# Patient Record
Sex: Female | Born: 1935 | Race: White | Hispanic: No | Smoking: Former smoker
Health system: Southern US, Community
[De-identification: ages and names within clinical notes are randomized; demographics above are authoritative.]

## PROBLEM LIST (undated history)

## (undated) DIAGNOSIS — Z923 Personal history of irradiation: Secondary | ICD-10-CM

## (undated) DIAGNOSIS — I10 Essential (primary) hypertension: Secondary | ICD-10-CM

## (undated) DIAGNOSIS — C50012 Malignant neoplasm of nipple and areola, left female breast: Secondary | ICD-10-CM

## (undated) DIAGNOSIS — E079 Disorder of thyroid, unspecified: Secondary | ICD-10-CM

## (undated) HISTORY — DX: Disorder of thyroid, unspecified: E07.9

## (undated) HISTORY — DX: Essential (primary) hypertension: I10

## (undated) HISTORY — DX: Malignant neoplasm of nipple and areola, left female breast: C50.012

## (undated) HISTORY — PX: CATARACT EXTRACTION: SUR2

---

## 2004-02-17 ENCOUNTER — Ambulatory Visit: Payer: Self-pay | Admitting: Gastroenterology

## 2004-05-08 ENCOUNTER — Ambulatory Visit: Payer: Self-pay | Admitting: Internal Medicine

## 2005-03-06 ENCOUNTER — Ambulatory Visit: Payer: Self-pay | Admitting: Ophthalmology

## 2005-03-12 ENCOUNTER — Ambulatory Visit: Payer: Self-pay | Admitting: Ophthalmology

## 2005-05-31 ENCOUNTER — Ambulatory Visit: Payer: Self-pay | Admitting: Internal Medicine

## 2005-06-06 ENCOUNTER — Ambulatory Visit: Payer: Self-pay | Admitting: Internal Medicine

## 2006-03-04 ENCOUNTER — Ambulatory Visit: Payer: Self-pay | Admitting: Ophthalmology

## 2006-03-11 ENCOUNTER — Ambulatory Visit: Payer: Self-pay | Admitting: Ophthalmology

## 2006-06-03 ENCOUNTER — Ambulatory Visit: Payer: Self-pay | Admitting: Internal Medicine

## 2007-06-05 ENCOUNTER — Ambulatory Visit: Payer: Self-pay | Admitting: Internal Medicine

## 2008-06-08 ENCOUNTER — Ambulatory Visit: Payer: Self-pay | Admitting: Internal Medicine

## 2008-06-10 ENCOUNTER — Ambulatory Visit: Payer: Self-pay | Admitting: Internal Medicine

## 2009-06-14 ENCOUNTER — Ambulatory Visit: Payer: Self-pay | Admitting: Internal Medicine

## 2010-06-20 ENCOUNTER — Ambulatory Visit: Payer: Self-pay | Admitting: Internal Medicine

## 2011-02-14 ENCOUNTER — Emergency Department: Payer: Self-pay | Admitting: Emergency Medicine

## 2011-02-14 LAB — BASIC METABOLIC PANEL
Anion Gap: 6 — ABNORMAL LOW (ref 7–16)
BUN: 17 mg/dL (ref 7–18)
Calcium, Total: 9.8 mg/dL (ref 8.5–10.1)
EGFR (African American): >60
EGFR (Non-African Amer.): >60
Glucose: 112 mg/dL — ABNORMAL HIGH (ref 65–99)
Potassium: 5.7 mmol/L — ABNORMAL HIGH (ref 3.5–5.1)
Sodium: 135 mmol/L — ABNORMAL LOW (ref 136–145)

## 2011-02-14 LAB — CBC
HGB: 12.9 g/dL (ref 12.0–16.0)
MCV: 95 fL (ref 80–100)
Platelet: 148 10*3/uL — ABNORMAL LOW (ref 150–440)
RBC: 4.01 10*6/uL (ref 3.80–5.20)
RDW: 13.4 % (ref 11.5–14.5)
WBC: 9.4 10*3/uL (ref 3.6–11.0)

## 2011-02-19 ENCOUNTER — Encounter: Payer: Self-pay | Admitting: Internal Medicine

## 2011-03-09 ENCOUNTER — Encounter: Payer: Self-pay | Admitting: Internal Medicine

## 2011-04-17 ENCOUNTER — Ambulatory Visit: Payer: Self-pay | Admitting: Oncology

## 2011-04-17 LAB — CBC CANCER CENTER
Basophil #: 0 x10 3/mm (ref 0.0–0.1)
Eosinophil %: 0.6 %
HCT: 35.1 % (ref 35.0–47.0)
Lymphocyte #: 1.9 x10 3/mm (ref 1.0–3.6)
Lymphocyte %: 27.3 %
Monocyte %: 9.9 %
Neutrophil #: 4.2 x10 3/mm (ref 1.4–6.5)
RDW: 14.3 % (ref 11.5–14.5)
WBC: 6.8 x10 3/mm (ref 3.6–11.0)

## 2011-04-17 LAB — COMPREHENSIVE METABOLIC PANEL
Albumin: 4.1 g/dL (ref 3.4–5.0)
Alkaline Phosphatase: 64 U/L (ref 50–136)
Anion Gap: 9 (ref 7–16)
Bilirubin,Total: 0.6 mg/dL (ref 0.2–1.0)
Chloride: 102 mmol/L (ref 98–107)
Co2: 31 mmol/L (ref 21–32)
Creatinine: 1.07 mg/dL (ref 0.60–1.30)
Glucose: 99 mg/dL (ref 65–99)
Osmolality: 288 (ref 275–301)
SGOT(AST): 20 U/L (ref 15–37)
SGPT (ALT): 35 U/L

## 2011-04-18 LAB — CANCER ANTIGEN 19-9: CA 19-9: 14 U/mL (ref 0–35)

## 2011-05-07 ENCOUNTER — Ambulatory Visit: Payer: Self-pay | Admitting: Oncology

## 2011-06-06 ENCOUNTER — Ambulatory Visit: Payer: Self-pay | Admitting: Oncology

## 2011-07-24 ENCOUNTER — Ambulatory Visit: Payer: Self-pay | Admitting: Oncology

## 2011-08-06 ENCOUNTER — Ambulatory Visit: Payer: Self-pay | Admitting: Oncology

## 2011-12-24 ENCOUNTER — Ambulatory Visit: Payer: Self-pay | Admitting: Oncology

## 2012-02-11 ENCOUNTER — Ambulatory Visit: Payer: Self-pay | Admitting: Oncology

## 2012-03-08 ENCOUNTER — Ambulatory Visit: Payer: Self-pay | Admitting: Oncology

## 2012-06-25 ENCOUNTER — Ambulatory Visit: Payer: Self-pay | Admitting: Family Medicine

## 2012-08-05 ENCOUNTER — Ambulatory Visit: Payer: Self-pay | Admitting: Oncology

## 2012-08-20 LAB — COMPREHENSIVE METABOLIC PANEL
Alkaline Phosphatase: 58 U/L (ref 50–136)
Anion Gap: 5 — ABNORMAL LOW (ref 7–16)
BUN: 26 mg/dL — ABNORMAL HIGH (ref 7–18)
Bilirubin,Total: 0.8 mg/dL (ref 0.2–1.0)
Calcium, Total: 9.4 mg/dL (ref 8.5–10.1)
Chloride: 100 mmol/L (ref 98–107)
Co2: 31 mmol/L (ref 21–32)
Creatinine: 1.29 mg/dL (ref 0.60–1.30)
EGFR (African American): 44 — ABNORMAL LOW
Osmolality: 279 (ref 275–301)
SGOT(AST): 24 U/L (ref 15–37)
SGPT (ALT): 31 U/L (ref 12–78)

## 2012-08-20 LAB — CBC CANCER CENTER
Eosinophil #: 0.1 x10 3/mm (ref 0.0–0.7)
HGB: 13.7 g/dL (ref 12.0–16.0)
Lymphocyte %: 43.2 %
MCHC: 34.9 g/dL (ref 32.0–36.0)
Monocyte %: 12.5 %
Neutrophil %: 42.5 %
RBC: 4.31 10*6/uL (ref 3.80–5.20)
RDW: 13.8 % (ref 11.5–14.5)

## 2012-09-05 ENCOUNTER — Ambulatory Visit: Payer: Self-pay | Admitting: Oncology

## 2013-02-05 DIAGNOSIS — C50012 Malignant neoplasm of nipple and areola, left female breast: Secondary | ICD-10-CM

## 2013-02-05 HISTORY — PX: BREAST BIOPSY: SHX20

## 2013-02-05 HISTORY — PX: BREAST LUMPECTOMY: SHX2

## 2013-02-05 HISTORY — DX: Malignant neoplasm of nipple and areola, left female breast: C50.012

## 2013-07-06 ENCOUNTER — Ambulatory Visit: Payer: Self-pay | Admitting: Family Medicine

## 2013-08-19 ENCOUNTER — Ambulatory Visit: Payer: Self-pay | Admitting: Surgery

## 2013-08-19 LAB — BASIC METABOLIC PANEL
Anion Gap: 7 (ref 7–16)
BUN: 20 mg/dL — AB (ref 7–18)
CALCIUM: 9.4 mg/dL (ref 8.5–10.1)
CHLORIDE: 102 mmol/L (ref 98–107)
CO2: 30 mmol/L (ref 21–32)
Creatinine: 1.02 mg/dL (ref 0.60–1.30)
EGFR (Non-African Amer.): 50 — ABNORMAL LOW
GFR CALC AF AMER: 59 — AB
Glucose: 94 mg/dL (ref 65–99)
OSMOLALITY: 280 (ref 275–301)
Potassium: 3.6 mmol/L (ref 3.5–5.1)
Sodium: 139 mmol/L (ref 136–145)

## 2013-08-19 LAB — CBC
HCT: 39.1 % (ref 35.0–47.0)
HGB: 13.4 g/dL (ref 12.0–16.0)
MCH: 31.9 pg (ref 26.0–34.0)
MCHC: 34.3 g/dL (ref 32.0–36.0)
MCV: 93 fL (ref 80–100)
Platelet: 185 10*3/uL (ref 150–440)
RBC: 4.21 10*6/uL (ref 3.80–5.20)
RDW: 13.4 % (ref 11.5–14.5)
WBC: 7.1 10*3/uL (ref 3.6–11.0)

## 2013-08-28 ENCOUNTER — Ambulatory Visit: Payer: Self-pay | Admitting: Surgery

## 2013-09-02 LAB — PATHOLOGY REPORT

## 2013-09-17 ENCOUNTER — Ambulatory Visit: Payer: Self-pay | Admitting: Oncology

## 2013-09-23 LAB — COMPREHENSIVE METABOLIC PANEL
ANION GAP: 10 (ref 7–16)
AST: 23 U/L (ref 15–37)
Albumin: 4.1 g/dL (ref 3.4–5.0)
Alkaline Phosphatase: 55 U/L
BUN: 14 mg/dL (ref 7–18)
Bilirubin,Total: 0.6 mg/dL (ref 0.2–1.0)
CALCIUM: 8.6 mg/dL (ref 8.5–10.1)
CHLORIDE: 102 mmol/L (ref 98–107)
Co2: 30 mmol/L (ref 21–32)
Creatinine: 1.32 mg/dL — ABNORMAL HIGH (ref 0.60–1.30)
EGFR (Non-African Amer.): 37 — ABNORMAL LOW
GFR CALC AF AMER: 43 — AB
Glucose: 103 mg/dL — ABNORMAL HIGH (ref 65–99)
OSMOLALITY: 284 (ref 275–301)
Potassium: 3.4 mmol/L — ABNORMAL LOW (ref 3.5–5.1)
SGPT (ALT): 36 U/L
Sodium: 142 mmol/L (ref 136–145)
TOTAL PROTEIN: 7.5 g/dL (ref 6.4–8.2)

## 2013-09-23 LAB — CBC CANCER CENTER
Basophil #: 0.1 x10 3/mm (ref 0.0–0.1)
Basophil %: 1 %
Eosinophil #: 0.1 x10 3/mm (ref 0.0–0.7)
Eosinophil %: 1.3 %
HCT: 41 % (ref 35.0–47.0)
HGB: 13.8 g/dL (ref 12.0–16.0)
LYMPHS PCT: 37 %
Lymphocyte #: 2.1 x10 3/mm (ref 1.0–3.6)
MCH: 31.4 pg (ref 26.0–34.0)
MCHC: 33.6 g/dL (ref 32.0–36.0)
MCV: 93 fL (ref 80–100)
Monocyte #: 0.6 x10 3/mm (ref 0.2–0.9)
Monocyte %: 10.6 %
Neutrophil #: 2.8 x10 3/mm (ref 1.4–6.5)
Neutrophil %: 50.1 %
Platelet: 181 x10 3/mm (ref 150–440)
RBC: 4.4 10*6/uL (ref 3.80–5.20)
RDW: 13.3 % (ref 11.5–14.5)
WBC: 5.5 x10 3/mm (ref 3.6–11.0)

## 2013-09-23 LAB — CREATINE: Creatine, Serum: 1.32

## 2013-09-28 LAB — PROT IMMUNOELECTROPHORES(ARMC)

## 2013-09-28 LAB — KAPPA/LAMBDA FREE LIGHT CHAINS (ARMC)

## 2013-10-06 ENCOUNTER — Ambulatory Visit: Payer: Self-pay | Admitting: Oncology

## 2013-10-23 LAB — CBC CANCER CENTER
BASOS PCT: 0.7 %
Basophil #: 0 x10 3/mm (ref 0.0–0.1)
Eosinophil #: 0.1 x10 3/mm (ref 0.0–0.7)
Eosinophil %: 1.9 %
HCT: 39.4 % (ref 35.0–47.0)
HGB: 13.2 g/dL (ref 12.0–16.0)
LYMPHS PCT: 37.3 %
Lymphocyte #: 2.1 x10 3/mm (ref 1.0–3.6)
MCH: 31.1 pg (ref 26.0–34.0)
MCHC: 33.6 g/dL (ref 32.0–36.0)
MCV: 93 fL (ref 80–100)
MONOS PCT: 11.1 %
Monocyte #: 0.6 x10 3/mm (ref 0.2–0.9)
Neutrophil #: 2.7 x10 3/mm (ref 1.4–6.5)
Neutrophil %: 49 %
Platelet: 174 x10 3/mm (ref 150–440)
RBC: 4.25 10*6/uL (ref 3.80–5.20)
RDW: 13.3 % (ref 11.5–14.5)
WBC: 5.6 x10 3/mm (ref 3.6–11.0)

## 2013-10-30 LAB — CBC CANCER CENTER
BASOS ABS: 0 x10 3/mm (ref 0.0–0.1)
Basophil %: 0.7 %
Eosinophil #: 0.1 x10 3/mm (ref 0.0–0.7)
Eosinophil %: 2.4 %
HCT: 39.5 % (ref 35.0–47.0)
HGB: 13.2 g/dL (ref 12.0–16.0)
Lymphocyte #: 2 x10 3/mm (ref 1.0–3.6)
Lymphocyte %: 36.5 %
MCH: 31.2 pg (ref 26.0–34.0)
MCHC: 33.4 g/dL (ref 32.0–36.0)
MCV: 93 fL (ref 80–100)
MONO ABS: 0.6 x10 3/mm (ref 0.2–0.9)
Monocyte %: 10.6 %
NEUTROS PCT: 49.8 %
Neutrophil #: 2.7 x10 3/mm (ref 1.4–6.5)
Platelet: 180 x10 3/mm (ref 150–440)
RBC: 4.24 10*6/uL (ref 3.80–5.20)
RDW: 13.4 % (ref 11.5–14.5)
WBC: 5.4 x10 3/mm (ref 3.6–11.0)

## 2013-11-05 ENCOUNTER — Ambulatory Visit: Payer: Self-pay | Admitting: Oncology

## 2013-11-06 LAB — CBC CANCER CENTER
BASOS PCT: 1 %
Basophil #: 0.1 x10 3/mm (ref 0.0–0.1)
Eosinophil #: 0.1 x10 3/mm (ref 0.0–0.7)
Eosinophil %: 2.1 %
HCT: 39.5 % (ref 35.0–47.0)
HGB: 13 g/dL (ref 12.0–16.0)
Lymphocyte #: 2 x10 3/mm (ref 1.0–3.6)
Lymphocyte %: 34.4 %
MCH: 30.9 pg (ref 26.0–34.0)
MCHC: 33 g/dL (ref 32.0–36.0)
MCV: 94 fL (ref 80–100)
MONOS PCT: 13.2 %
Monocyte #: 0.8 x10 3/mm (ref 0.2–0.9)
Neutrophil #: 2.9 x10 3/mm (ref 1.4–6.5)
Neutrophil %: 49.3 %
Platelet: 182 x10 3/mm (ref 150–440)
RBC: 4.22 10*6/uL (ref 3.80–5.20)
RDW: 13.3 % (ref 11.5–14.5)
WBC: 5.8 x10 3/mm (ref 3.6–11.0)

## 2013-11-13 LAB — CBC CANCER CENTER
BASOS ABS: 0.1 x10 3/mm (ref 0.0–0.1)
Basophil %: 1 %
EOS PCT: 2.1 %
Eosinophil #: 0.1 x10 3/mm (ref 0.0–0.7)
HCT: 38.6 % (ref 35.0–47.0)
HGB: 13 g/dL (ref 12.0–16.0)
LYMPHS PCT: 29.6 %
Lymphocyte #: 1.5 x10 3/mm (ref 1.0–3.6)
MCH: 31.5 pg (ref 26.0–34.0)
MCHC: 33.8 g/dL (ref 32.0–36.0)
MCV: 93 fL (ref 80–100)
MONO ABS: 0.7 x10 3/mm (ref 0.2–0.9)
Monocyte %: 14.2 %
NEUTROS PCT: 53.1 %
Neutrophil #: 2.7 x10 3/mm (ref 1.4–6.5)
Platelet: 164 x10 3/mm (ref 150–440)
RBC: 4.15 10*6/uL (ref 3.80–5.20)
RDW: 13.7 % (ref 11.5–14.5)
WBC: 5 x10 3/mm (ref 3.6–11.0)

## 2013-11-20 LAB — CBC CANCER CENTER
BASOS PCT: 0.8 %
Basophil #: 0 x10 3/mm (ref 0.0–0.1)
Eosinophil #: 0.1 x10 3/mm (ref 0.0–0.7)
Eosinophil %: 1.8 %
HCT: 38.7 % (ref 35.0–47.0)
HGB: 13.1 g/dL (ref 12.0–16.0)
LYMPHS ABS: 1.3 x10 3/mm (ref 1.0–3.6)
LYMPHS PCT: 29.4 %
MCH: 31.6 pg (ref 26.0–34.0)
MCHC: 34 g/dL (ref 32.0–36.0)
MCV: 93 fL (ref 80–100)
Monocyte #: 0.6 x10 3/mm (ref 0.2–0.9)
Monocyte %: 13.3 %
NEUTROS PCT: 54.7 %
Neutrophil #: 2.4 x10 3/mm (ref 1.4–6.5)
Platelet: 169 x10 3/mm (ref 150–440)
RBC: 4.15 10*6/uL (ref 3.80–5.20)
RDW: 13.7 % (ref 11.5–14.5)
WBC: 4.4 x10 3/mm (ref 3.6–11.0)

## 2013-11-27 LAB — CBC CANCER CENTER
Basophil #: 0 x10 3/mm (ref 0.0–0.1)
Basophil %: 1 %
EOS ABS: 0.1 x10 3/mm (ref 0.0–0.7)
EOS PCT: 1.4 %
HCT: 39.5 % (ref 35.0–47.0)
HGB: 13.2 g/dL (ref 12.0–16.0)
LYMPHS PCT: 28.4 %
Lymphocyte #: 1.5 x10 3/mm (ref 1.0–3.6)
MCH: 31.5 pg (ref 26.0–34.0)
MCHC: 33.5 g/dL (ref 32.0–36.0)
MCV: 94 fL (ref 80–100)
MONO ABS: 0.6 x10 3/mm (ref 0.2–0.9)
Monocyte %: 11 %
NEUTROS ABS: 3 x10 3/mm (ref 1.4–6.5)
NEUTROS PCT: 58.2 %
PLATELETS: 172 x10 3/mm (ref 150–440)
RBC: 4.2 10*6/uL (ref 3.80–5.20)
RDW: 13.5 % (ref 11.5–14.5)
WBC: 5.1 x10 3/mm (ref 3.6–11.0)

## 2013-12-06 ENCOUNTER — Ambulatory Visit: Payer: Self-pay | Admitting: Oncology

## 2014-01-05 ENCOUNTER — Ambulatory Visit: Payer: Self-pay | Admitting: Oncology

## 2014-05-25 DIAGNOSIS — C50012 Malignant neoplasm of nipple and areola, left female breast: Secondary | ICD-10-CM | POA: Insufficient documentation
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
## 2014-05-29 NOTE — Op Note (Signed)
PATIENT NAME:  Christine Owen, Christine Owen MR#:  [id number] DATE OF BIRTH:  Dec 06, 1926  DATE OF PROCEDURE:  08/28/2013  PREOPERATIVE DIAGNOSIS: Paget disease of the left nipple.   POSTOPERATIVE DIAGNOSIS: Paget disease of the left nipple.   PROCEDURE: Left partial mastectomy.   SURGEON: Loreli Dollar, M.D.   ANESTHESIA: General.   INDICATIONS: This 81 year old female, who recently developed some slight red discoloration of the left nipple. A nipple biopsy demonstrated Paget disease. There was no invasive cancer seen, but was in situ. Surgery was recommended for definitive treatment.   DESCRIPTION OF PROCEDURE: The patient was placed on the operating table in the supine position under general anesthesia. The left arm was placed on a lateral arm support. The left breast was prepared with ChloraPrep and draped in a sterile manner.   A transversely oriented elliptical excision was carried out removing most of the areola and all of the nipple, and dissected down through subcutaneous tissues. Electrocautery was used for hemostasis. The dissection was carried approximately 2 cm deep into the central aspect of the breast. The 3 o'clock lateral end of the skin ellipse was tagged with a stitch for the pathologist's orientation, and the specimen was submitted for routine pathology. The wound was inspected. One clamped bleeding point was suture ligated with 4-0 chromic. A number of other bleeding points were cauterized. Hemostasis was subsequently intact.   Next, the subcutaneous tissues were approximated with interrupted 4-0 chromic. The skin was closed with running 4-0 Monocryl subcuticular suture and Dermabond. The patient tolerated the surgery satisfactorily and was prepared for transfer to the recovery room.   ____________________________ Lenna Sciara. Rochel Brome, MD jws:jr D: 08/28/2013 13:31:37 ET T: 08/28/2013 14:50:27 ET JOB#: [id number]  cc: Loreli Dollar, MD, <Dictator> Loreli Dollar MD ELECTRONICALLY  SIGNED 09/02/2013 17:44

## 2014-06-16 ENCOUNTER — Ambulatory Visit: Payer: Self-pay | Admitting: Radiation Oncology

## 2014-06-16 ENCOUNTER — Ambulatory Visit
Admission: RE | Admit: 2014-06-16 | Discharge: 2014-06-16 | Disposition: A | Discharge status: Home / Self Care | Payer: Medicare Other | Source: Ambulatory Visit | Attending: Radiation Oncology | Admitting: Radiation Oncology

## 2014-06-16 ENCOUNTER — Encounter (INDEPENDENT_AMBULATORY_CARE_PROVIDER_SITE_OTHER): Payer: Self-pay

## 2014-06-16 ENCOUNTER — Encounter: Payer: Self-pay | Admitting: Radiation Oncology

## 2014-06-16 VITALS — BP 152/72 | HR 64 | Temp 97.0°F | Resp 20 | Ht 61.0 in | Wt 127.6 lb

## 2014-06-16 DIAGNOSIS — C50912 Malignant neoplasm of unspecified site of left female breast: Secondary | ICD-10-CM

## 2014-06-16 NOTE — Progress Notes (Signed)
Radiation Oncology Follow up Note  Name: Christine Owen   Date:   06/16/2014 MRN:  [id number] DOB: 06-11-1926    This 81 y.o. female presents to the clinic today for follow-up of breast cancer i.e. Paget's disease.  REFERRING PROVIDER: Dr. Tamala Julian  HPI: Patient is an 81 year old female now out 6 months having completed radiation therapy to her left breast after sacrifice of her nipple areolar complex for Paget's disease. Patient had declined aromatase inhibitor and still on my recommendation declines that treatment. She is otherwise doing well. She specifically denies breast tenderness cough or bone pain. Has yet not yet had a follow-up mammogram still confused about her appointments and I am arranging for her to see Dr. Tamala Julian in follow-up..  COMPLICATIONS OF TREATMENT: none  FOLLOW UP COMPLIANCE: keeps appointments   PHYSICAL EXAM:  BP 152/72 mmHg  Pulse 64  Temp(Src) 97 F (36.1 C)  Resp 20  Ht 5\' 1"  (1.549 m)  Wt 127 lb 10.3 oz (57.9 kg)  BMI 24.13 kg/m2 Well-developed elderly female in NAD. She is status post wide local excision of the left breast with some sacrifice the nipple areolar complex. No dominant mass or nodularity is noted in either breast in 2 positions examined. No axillary or supraclavicular adenopathy is identified. Lungs are clear to A&P cardiac examination is essentially unremarkable. Well-developed well-nourished patient in NAD. HEENT reveals PERLA, EOMI, discs not visualized.  Oral cavity is clear. No oral mucosal lesions are identified. Neck is clear without evidence of cervical or supraclavicular adenopathy. Lungs are clear to A&P. Cardiac examination is essentially unremarkable with regular rate and rhythm without murmur rub or thrill. Abdomen is benign with no organomegaly or masses noted. Motor sensory and DTR levels are equal and symmetric in the upper and lower extremities. Cranial nerves II through XII are grossly intact. Proprioception is intact. No  peripheral adenopathy or edema is identified. No motor or sensory levels are noted. Crude visual fields are within normal range.   RADIOLOGY RESULTS: Follow-up mammograms will be ordered  PLAN: At this time am please were overall progress. She still confused better appointments nature my nurse arranges for her to see Dr. Tamala Julian in follow-up and he tends to and will order her follow-up mammograms. I have asked to see her back in 1 year for follow-up. Again she is declining aromatase inhibitor therapy. Patient knows to call with any concerns.  I would like to take this opportunity for allowing me to participate in the care of your patient.Armstead Peaks., MD

## 2014-06-18 ENCOUNTER — Other Ambulatory Visit: Payer: Self-pay | Admitting: *Deleted

## 2014-06-18 DIAGNOSIS — C50012 Malignant neoplasm of nipple and areola, left female breast: Secondary | ICD-10-CM

## 2014-06-22 ENCOUNTER — Inpatient Hospital Stay: Payer: Medicare Other | Attending: Oncology | Admitting: Oncology

## 2014-06-22 ENCOUNTER — Inpatient Hospital Stay: Payer: Medicare Other

## 2014-06-22 VITALS — BP 150/91 | HR 66 | Temp 96.1°F | Wt 129.2 lb

## 2014-06-22 DIAGNOSIS — E079 Disorder of thyroid, unspecified: Secondary | ICD-10-CM | POA: Diagnosis not present

## 2014-06-22 DIAGNOSIS — Z853 Personal history of malignant neoplasm of breast: Secondary | ICD-10-CM

## 2014-06-22 DIAGNOSIS — I10 Essential (primary) hypertension: Secondary | ICD-10-CM

## 2014-06-22 DIAGNOSIS — Z8781 Personal history of (healed) traumatic fracture: Secondary | ICD-10-CM

## 2014-06-22 DIAGNOSIS — Z923 Personal history of irradiation: Secondary | ICD-10-CM | POA: Diagnosis not present

## 2014-06-22 DIAGNOSIS — Z79899 Other long term (current) drug therapy: Secondary | ICD-10-CM | POA: Diagnosis not present

## 2014-06-22 DIAGNOSIS — M545 Low back pain: Secondary | ICD-10-CM

## 2014-06-22 DIAGNOSIS — Z87891 Personal history of nicotine dependence: Secondary | ICD-10-CM | POA: Diagnosis not present

## 2014-06-22 DIAGNOSIS — C50012 Malignant neoplasm of nipple and areola, left female breast: Secondary | ICD-10-CM

## 2014-06-22 DIAGNOSIS — M818 Other osteoporosis without current pathological fracture: Secondary | ICD-10-CM | POA: Diagnosis not present

## 2014-06-22 LAB — COMPREHENSIVE METABOLIC PANEL
ALT: 23 U/L (ref 14–54)
AST: 22 U/L (ref 15–41)
Albumin: 4.3 g/dL (ref 3.5–5.0)
Alkaline Phosphatase: 40 U/L (ref 38–126)
Anion gap: 8 (ref 5–15)
BILIRUBIN TOTAL: 0.9 mg/dL (ref 0.3–1.2)
BUN: 19 mg/dL (ref 6–20)
CALCIUM: 9.5 mg/dL (ref 8.9–10.3)
CO2: 29 mmol/L (ref 22–32)
Chloride: 101 mmol/L (ref 101–111)
Creatinine, Ser: 1.06 mg/dL — ABNORMAL HIGH (ref 0.44–1.00)
GFR, EST AFRICAN AMERICAN: 54 mL/min — AB (ref >60)
GFR, EST NON AFRICAN AMERICAN: 47 mL/min — AB (ref >60)
Glucose, Bld: 92 mg/dL (ref 65–99)
Potassium: 3.5 mmol/L (ref 3.5–5.1)
SODIUM: 138 mmol/L (ref 135–145)
Total Protein: 6.9 g/dL (ref 6.5–8.1)

## 2014-06-22 LAB — CBC WITH DIFFERENTIAL/PLATELET
BASOS ABS: 0 10*3/uL (ref 0–0.1)
Basophils Relative: 1 %
EOS ABS: 0.1 10*3/uL (ref 0–0.7)
Eosinophils Relative: 1 %
HEMATOCRIT: 38.6 % (ref 35.0–47.0)
HEMOGLOBIN: 13 g/dL (ref 12.0–16.0)
LYMPHS ABS: 1.5 10*3/uL (ref 1.0–3.6)
Lymphocytes Relative: 30 %
MCH: 31.7 pg (ref 26.0–34.0)
MCHC: 33.7 g/dL (ref 32.0–36.0)
MCV: 93.9 fL (ref 80.0–100.0)
Monocytes Absolute: 0.6 10*3/uL (ref 0.2–0.9)
Monocytes Relative: 13 %
NEUTROS PCT: 55 %
Neutro Abs: 2.7 10*3/uL (ref 1.4–6.5)
PLATELETS: 172 10*3/uL (ref 150–440)
RBC: 4.11 MIL/uL (ref 3.80–5.20)
RDW: 13.8 % (ref 11.5–14.5)
WBC: 4.9 10*3/uL (ref 3.6–11.0)

## 2014-06-27 ENCOUNTER — Encounter: Payer: Self-pay | Admitting: Oncology

## 2014-06-27 DIAGNOSIS — S12100A Unspecified displaced fracture of second cervical vertebra, initial encounter for closed fracture: Secondary | ICD-10-CM | POA: Insufficient documentation

## 2014-06-27 DIAGNOSIS — E039 Hypothyroidism, unspecified: Secondary | ICD-10-CM | POA: Insufficient documentation

## 2014-06-27 DIAGNOSIS — K219 Gastro-esophageal reflux disease without esophagitis: Secondary | ICD-10-CM | POA: Insufficient documentation

## 2014-06-27 DIAGNOSIS — S22009A Unspecified fracture of unspecified thoracic vertebra, initial encounter for closed fracture: Secondary | ICD-10-CM | POA: Insufficient documentation

## 2014-06-27 DIAGNOSIS — H409 Unspecified glaucoma: Secondary | ICD-10-CM | POA: Insufficient documentation

## 2014-06-27 DIAGNOSIS — S3210XA Unspecified fracture of sacrum, initial encounter for closed fracture: Secondary | ICD-10-CM | POA: Insufficient documentation

## 2014-06-27 DIAGNOSIS — R7303 Prediabetes: Secondary | ICD-10-CM | POA: Insufficient documentation

## 2014-06-27 NOTE — Progress Notes (Signed)
[place] @ [facility] [phone]  Fax:([phone]) [place] OB: 19-Apr-1926  MR#: [id number]  JYN#:[id number]  Patient Care Team: Dion Body, MD as PCP - General (Family Medicine)  CHIEF COMPLAINT:  Chief Complaint  Patient presents with  . Follow-up    Oncology History   81 year old female status post wide local excision of nipple areole complex of the right breast for Paget's disease  STATUS POST RADIATION THERAPY (FINISHED IN oCTOBER OF 2015) pATIENT IS NOT EAGER TO START ANY ANTI-HORMONAL THERAPY        Paget's disease of left breast    Initial Diagnosis Paget's disease of left breast    No flowsheet data found.  INTERVAL HISTORY: 81 year old lady with history of Paget's disease of the right breast.  Patient does not want any anti-hormonal therapy.  Patient is osteoporosis cervical fracture and low back pain secondary to compression fracture.  Patient is here for ongoing evaluation and treatment consideration.  No bony pains.  Getting regular mammogram.  REVIEW OF SYSTEMS:   Gen. status: Patient is feeling better.  HEENT: Neck pain is improved.  No soreness in the mouth no difficulty swallowing.  Lungs: No cough or shortness of breath.  Cardiac no chest pain.  . GI : no nausea.  No vomiting.  No diarrhea.  No rectal bleeding.  Musculoskeletal system: Chronic low back pain.  Neck pain is improved. Lower extremity no swelling. Neurological system no headache or dizziness. Skin: No rash. All other systems have been reviewed   As per HPI. Otherwise, a complete review of systems is negatve.  PAST MEDICAL HISTORY: Past Medical History  Diagnosis Date  . Hypertension   . Thyroid disease   . Paget's disease of left breast     PAST SURGICAL HISTORY: Past Surgical History  Procedure Laterality Date  . Cataract extraction      FAMILY HISTORY No family history on file.  GYNECOLOGIC HISTORY:  No LMP recorded.     ADVANCED  DIRECTIVES:    HEALTH MAINTENANCE: History  Substance Use Topics  . Smoking status: Former Research scientist (life sciences)  . Smokeless tobacco: Not on file  . Alcohol Use: Not on file     Colonoscopy:  PAP:  Bone density:  Lipid panel:  Allergies  Allergen Reactions  . Alendronate Sodium Other (See Comments)    Worsened acid reflux  . Ibandronic Acid Other (See Comments)    Worsened acid reflux  . Chlorhexidine Rash    Current Outpatient Prescriptions  Medication Sig Dispense Refill  . atorvastatin (LIPITOR) 20 MG tablet Take 20 mg by mouth daily. At bedtime    . Calcium Carb-Cholecalciferol 600-200 MG-UNIT TABS Take 1 tablet by mouth 3 (three) times daily.    Marland Kitchen denosumab (PROLIA) 60 MG/ML SOLN injection Inject 60 mg into the skin every 6 (six) months. Administer in upper arm, thigh, or abdomen once a year.    . hydrochlorothiazide (HYDRODIURIL) 25 MG tablet Take 25 mg by mouth daily.    Marland Kitchen levothyroxine (SYNTHROID, LEVOTHROID) 75 MCG tablet Take 75 mcg by mouth daily before breakfast.    . meclizine (ANTIVERT) 25 MG tablet Take 25 mg by mouth 3 (three) times daily as needed for dizziness.    Marland Kitchen omeprazole (PRILOSEC) 20 MG capsule Take 20 mg by mouth daily.    . timolol (TIMOPTIC) 0.5 % ophthalmic solution     . latanoprost (XALATAN) 0.005 % ophthalmic solution Place 1 drop into both eyes at bedtime.    Marland Kitchen  traMADol (ULTRAM) 50 MG tablet Take by mouth every 4 (four) hours as needed for moderate pain.     No current facility-administered medications for this visit.    OBJECTIVE:  Filed Vitals:   06/22/14 1123  BP: 150/91  Pulse: 66  Temp: 96.1 F (35.6 C)     Body mass index is 24.42 kg/(m^2).    ECOG FS:1 - Symptomatic but completely ambulatory  PHYSICAL EXAM: GENERAL:  Well developed, well nourished, sitting comfortably in the exam room in no acute distress. MENTAL STATUS:  Alert and oriented to person, place and time. HEAD:    Normocephalic, atraumatic, face symmetric, no Cushingoid  features. EYES: . oropharynx clear without lesion.  Tongue normal. Mucous membranes moist.  RESPIRATORY:  Clear to auscultation without rales, wheezes or rhonchi. CARDIOVASCULAR:  Regular rate and rhythm without murmur, rub or gallop. BREAST:  Right breast without masses, skin changes or nipple discharge.  Left breast without masses, skin changes or nipple discharge. ABDOMEN:  Soft, non-tender, with active bowel sounds, and no hepatosplenomegaly.  No masses. BACK:  No CVA tenderness.  No tenderness on percussion of the back or rib cage. SKIN:  No rashes, ulcers or lesions. EXTREMITIES: No edema, no skin discoloration or tenderness.  No palpable cords. LYMPH NODES: No palpable cervical, supraclavicular, axillary or inguinal adenopathy  NEUROLOGICAL: Unremarkable. PSYCH:  Appropriate.  LAB RESULTS:  Appointment on 06/22/2014  Component Date Value Ref Range Status  . WBC 06/22/2014 4.9  3.6 - 11.0 K/uL Final  . RBC 06/22/2014 4.11  3.80 - 5.20 MIL/uL Final  . Hemoglobin 06/22/2014 13.0  12.0 - 16.0 g/dL Final  . HCT 06/22/2014 38.6  35.0 - 47.0 % Final  . MCV 06/22/2014 93.9  80.0 - 100.0 fL Final  . MCH 06/22/2014 31.7  26.0 - 34.0 pg Final  . MCHC 06/22/2014 33.7  32.0 - 36.0 g/dL Final  . RDW 06/22/2014 13.8  11.5 - 14.5 % Final  . Platelets 06/22/2014 172  150 - 440 K/uL Final  . Neutrophils Relative % 06/22/2014 55   Final  . Neutro Abs 06/22/2014 2.7  1.4 - 6.5 K/uL Final  . Lymphocytes Relative 06/22/2014 30   Final  . Lymphs Abs 06/22/2014 1.5  1.0 - 3.6 K/uL Final  . Monocytes Relative 06/22/2014 13   Final  . Monocytes Absolute 06/22/2014 0.6  0.2 - 0.9 K/uL Final  . Eosinophils Relative 06/22/2014 1   Final  . Eosinophils Absolute 06/22/2014 0.1  0 - 0.7 K/uL Final  . Basophils Relative 06/22/2014 1   Final  . Basophils Absolute 06/22/2014 0.0  0 - 0.1 K/uL Final  . Sodium 06/22/2014 138  135 - 145 mmol/L Final  . Potassium 06/22/2014 3.5  3.5 - 5.1 mmol/L Final  .  Chloride 06/22/2014 101  101 - 111 mmol/L Final  . CO2 06/22/2014 29  22 - 32 mmol/L Final  . Glucose, Bld 06/22/2014 92  65 - 99 mg/dL Final  . BUN 06/22/2014 19  6 - 20 mg/dL Final  . Creatinine, Ser 06/22/2014 1.06* 0.44 - 1.00 mg/dL Final  . Calcium 06/22/2014 9.5  8.9 - 10.3 mg/dL Final  . Total Protein 06/22/2014 6.9  6.5 - 8.1 g/dL Final  . Albumin 06/22/2014 4.3  3.5 - 5.0 g/dL Final  . AST 06/22/2014 22  15 - 41 U/L Final  . ALT 06/22/2014 23  14 - 54 U/L Final  . Alkaline Phosphatase 06/22/2014 40  38 - 126 U/L Final  .  Total Bilirubin 06/22/2014 0.9  0.3 - 1.2 mg/dL Final  . GFR calc non Af Amer 06/22/2014 47* >60 mL/min Final  . GFR calc Af Amer 06/22/2014 54* >60 mL/min Final   Comment: (NOTE) The eGFR has been calculated using the CKD EPI equation. This calculation has not been validated in all clinical situations. eGFR's persistently <60 mL/min signify possible Chronic Kidney Disease.   . Anion gap 06/22/2014 8  5 - 15 Final    No results found for: LABCA2 Lab Results  Component Value Date   CA199 14 04/17/2011     ASSESSMENT: 81 year old lady with a history of carcinoma of breast.  Paget's disease.  No evidence of recurrent or progressive disease will continue getting regular mammogram . Osteoporosis MEDICAL DECISION MAKING:  .  All lab data has been reviewed.  Mammogram is been scheduled. Mammogram is been scheduled for June of 2016 along with ultrasound if needed   Patient expressed understanding and was in agreement with this plan. She also understands that She can call clinic at any time with any questions, concerns, or complaints.    No matching staging information was found for the patient.  Forest Gleason, MD   06/27/2014 8:15 AM

## 2014-07-12 ENCOUNTER — Ambulatory Visit: Payer: Medicare Other

## 2014-07-12 ENCOUNTER — Other Ambulatory Visit: Payer: Self-pay | Admitting: Oncology

## 2014-07-12 ENCOUNTER — Ambulatory Visit
Admission: RE | Admit: 2014-07-12 | Discharge: 2014-07-12 | Disposition: A | Discharge status: Home / Self Care | Payer: Medicare Other | Source: Ambulatory Visit | Attending: Oncology | Admitting: Oncology

## 2014-07-12 DIAGNOSIS — Z9221 Personal history of antineoplastic chemotherapy: Secondary | ICD-10-CM | POA: Insufficient documentation

## 2014-07-12 DIAGNOSIS — C50012 Malignant neoplasm of nipple and areola, left female breast: Secondary | ICD-10-CM

## 2014-07-12 DIAGNOSIS — Z853 Personal history of malignant neoplasm of breast: Secondary | ICD-10-CM | POA: Diagnosis not present

## 2014-07-12 DIAGNOSIS — Z803 Family history of malignant neoplasm of breast: Secondary | ICD-10-CM | POA: Diagnosis not present

## 2014-07-23 ENCOUNTER — Other Ambulatory Visit: Payer: Self-pay | Admitting: Surgery

## 2014-07-23 DIAGNOSIS — Z853 Personal history of malignant neoplasm of breast: Secondary | ICD-10-CM

## 2015-06-22 ENCOUNTER — Other Ambulatory Visit: Payer: Self-pay | Admitting: *Deleted

## 2015-06-22 ENCOUNTER — Ambulatory Visit
Admission: RE | Admit: 2015-06-22 | Discharge: 2015-06-22 | Disposition: A | Discharge status: Home / Self Care | Payer: Medicare Other | Source: Ambulatory Visit | Attending: Radiation Oncology | Admitting: Radiation Oncology

## 2015-06-22 ENCOUNTER — Inpatient Hospital Stay (HOSPITAL_BASED_OUTPATIENT_CLINIC_OR_DEPARTMENT_OTHER): Payer: Medicare Other | Admitting: Family Medicine

## 2015-06-22 ENCOUNTER — Encounter: Payer: Self-pay | Admitting: Radiation Oncology

## 2015-06-22 ENCOUNTER — Inpatient Hospital Stay: Payer: Medicare Other | Attending: Oncology

## 2015-06-22 VITALS — BP 151/77 | HR 66 | Temp 97.4°F | Resp 18 | Wt 128.3 lb

## 2015-06-22 VITALS — BP 155/73 | HR 73 | Temp 97.3°F | Resp 19 | Wt 128.5 lb

## 2015-06-22 DIAGNOSIS — Z853 Personal history of malignant neoplasm of breast: Secondary | ICD-10-CM | POA: Insufficient documentation

## 2015-06-22 DIAGNOSIS — E079 Disorder of thyroid, unspecified: Secondary | ICD-10-CM | POA: Insufficient documentation

## 2015-06-22 DIAGNOSIS — G8929 Other chronic pain: Secondary | ICD-10-CM

## 2015-06-22 DIAGNOSIS — Z79899 Other long term (current) drug therapy: Secondary | ICD-10-CM

## 2015-06-22 DIAGNOSIS — I10 Essential (primary) hypertension: Secondary | ICD-10-CM | POA: Insufficient documentation

## 2015-06-22 DIAGNOSIS — C50012 Malignant neoplasm of nipple and areola, left female breast: Secondary | ICD-10-CM

## 2015-06-22 DIAGNOSIS — Z8781 Personal history of (healed) traumatic fracture: Secondary | ICD-10-CM | POA: Diagnosis not present

## 2015-06-22 DIAGNOSIS — C50912 Malignant neoplasm of unspecified site of left female breast: Secondary | ICD-10-CM

## 2015-06-22 DIAGNOSIS — Z803 Family history of malignant neoplasm of breast: Secondary | ICD-10-CM | POA: Insufficient documentation

## 2015-06-22 DIAGNOSIS — Z87891 Personal history of nicotine dependence: Secondary | ICD-10-CM | POA: Insufficient documentation

## 2015-06-22 DIAGNOSIS — M549 Dorsalgia, unspecified: Secondary | ICD-10-CM

## 2015-06-22 DIAGNOSIS — Z923 Personal history of irradiation: Secondary | ICD-10-CM

## 2015-06-22 LAB — COMPREHENSIVE METABOLIC PANEL
ALBUMIN: 4.5 g/dL (ref 3.5–5.0)
ALT: 25 U/L (ref 14–54)
AST: 23 U/L (ref 15–41)
Alkaline Phosphatase: 38 U/L (ref 38–126)
Anion gap: 7 (ref 5–15)
BUN: 30 mg/dL — AB (ref 6–20)
CHLORIDE: 103 mmol/L (ref 101–111)
CO2: 27 mmol/L (ref 22–32)
CREATININE: 1.21 mg/dL — AB (ref 0.44–1.00)
Calcium: 9.7 mg/dL (ref 8.9–10.3)
GFR calc Af Amer: 46 mL/min — ABNORMAL LOW (ref >60)
GFR, EST NON AFRICAN AMERICAN: 39 mL/min — AB (ref >60)
GLUCOSE: 102 mg/dL — AB (ref 65–99)
POTASSIUM: 3.5 mmol/L (ref 3.5–5.1)
Sodium: 137 mmol/L (ref 135–145)
Total Bilirubin: 1 mg/dL (ref 0.3–1.2)
Total Protein: 7.5 g/dL (ref 6.5–8.1)

## 2015-06-22 LAB — CBC WITH DIFFERENTIAL/PLATELET
BASOS ABS: 0 10*3/uL (ref 0–0.1)
BASOS PCT: 1 %
Eosinophils Absolute: 0.1 10*3/uL (ref 0–0.7)
Eosinophils Relative: 1 %
HEMATOCRIT: 37.6 % (ref 35.0–47.0)
Hemoglobin: 13.1 g/dL (ref 12.0–16.0)
LYMPHS PCT: 33 %
Lymphs Abs: 1.6 10*3/uL (ref 1.0–3.6)
MCH: 31.7 pg (ref 26.0–34.0)
MCHC: 34.9 g/dL (ref 32.0–36.0)
MCV: 90.8 fL (ref 80.0–100.0)
MONO ABS: 0.5 10*3/uL (ref 0.2–0.9)
Monocytes Relative: 9 %
NEUTROS ABS: 2.8 10*3/uL (ref 1.4–6.5)
Neutrophils Relative %: 56 %
PLATELETS: 166 10*3/uL (ref 150–440)
RBC: 4.14 MIL/uL (ref 3.80–5.20)
RDW: 13.5 % (ref 11.5–14.5)
WBC: 5 10*3/uL (ref 3.6–11.0)

## 2015-06-22 NOTE — Progress Notes (Signed)
Radiation Oncology Follow up Note  Name: Christine Owen   Date:   06/22/2015 MRN:  [id number] DOB: April 30, 1926    This 82 y.o. female presents to the clinic today for follow-up for Paget's disease of her left breast status post wide local excision and adjuvant radiation therapy now out a year and a half.  REFERRING PROVIDER: Dion Body, MD  HPI: Patient is an 82 year old female now out a year and a half having completed radiation therapy to her left breast status post sacrifice of her nipple areolar complex for Paget's disease.. She has declined anti-hormonal therapy. She is doing well she specifically denies breast tenderness cough or bone pain. She's not had a mammogram in a year and we are ordering that today.  COMPLICATIONS OF TREATMENT: none  FOLLOW UP COMPLIANCE: keeps appointments   PHYSICAL EXAM:  BP 151/77 mmHg  Pulse 66  Temp(Src) 97.4 F (36.3 C) (Tympanic)  Resp 18  Wt 128 lb 4.9 oz (58.2 kg) Left breast shows sacrifice of the nipple areolar complex. No dominant mass or nodularity is noted in either breast in 2 positions examined. No axillary or supraclavicular adenopathy is appreciated. Well-developed well-nourished patient in NAD. HEENT reveals PERLA, EOMI, discs not visualized.  Oral cavity is clear. No oral mucosal lesions are identified. Neck is clear without evidence of cervical or supraclavicular adenopathy. Lungs are clear to A&P. Cardiac examination is essentially unremarkable with regular rate and rhythm without murmur rub or thrill. Abdomen is benign with no organomegaly or masses noted. Motor sensory and DTR levels are equal and symmetric in the upper and lower extremities. Cranial nerves II through XII are grossly intact. Proprioception is intact. No peripheral adenopathy or edema is identified. No motor or sensory levels are noted. Crude visual fields are within normal range.  RADIOLOGY RESULTS: Bilateral mammograms were ordered  PLAN: At present time  she is doing well with no evidence of disease. I have ordered bilateral mammograms today again patient declines anti-hormonal therapy. I've asked to see her back in 1 year for follow-up. Patient knows to call with any concerns.  I would like to take this opportunity for allowing me to participate in the care of your patient.Armstead Peaks., MD

## 2015-06-22 NOTE — Progress Notes (Signed)
Patient here for R Breast Ca; Pagets disease. Doing well. No complaints. Next mammogram due in August.

## 2015-06-22 NOTE — Progress Notes (Signed)
Christine Owen  [phone]  [phone]     Christine Owen DOB: [birth year]  MR#: [id number]  TGG#:[id number]  Patient Care Team: Dion Body, MD as PCP - General (Family Medicine)  CHIEF COMPLAINT:  Follow-up regarding Paget's disease of left breast  INTERVAL HISTORY:  Patient is here for follow-up regarding left breast Paget's disease she is status post wide local excision of nipple areole complex as well as radiation therapy that was completed in October 2015. At that time patient had refused any further anti-hormone therapy. Her most recent mammogram was performed in June 2016 and reported as BI-RADS 2, benign. Overall patient reports feeling very well. She has some chronic back pain related to previous compression fractures but overall reports no other complaints.  REVIEW OF SYSTEMS:   Review of Systems  Constitutional: Negative for fever, chills, weight loss, malaise/fatigue and diaphoresis.  HENT: Negative.   Eyes: Negative.   Respiratory: Negative for cough, hemoptysis, sputum production, shortness of breath and wheezing.   Cardiovascular: Negative for chest pain, palpitations, orthopnea, claudication, leg swelling and PND.  Gastrointestinal: Negative for heartburn, nausea, vomiting, abdominal pain, diarrhea, constipation, blood in stool and melena.  Genitourinary: Negative.   Musculoskeletal: Positive for back pain.       Back pain chronic in nature due to previous fractures  Skin: Negative.   Neurological: Negative for dizziness, tingling, focal weakness, seizures and weakness.  Endo/Heme/Allergies: Does not bruise/bleed easily.  Psychiatric/Behavioral: Negative for depression. The patient is not nervous/anxious and does not have insomnia.     As per HPI. Otherwise, a complete review of systems is negatve.  ONCOLOGY HISTORY: Oncology History   82 year old female status post wide local excision of nipple areole complex of the right  breast for Paget's disease  STATUS POST RADIATION THERAPY (FINISHED IN oCTOBER OF 2015) pATIENT IS NOT EAGER TO START ANY ANTI-HORMONAL THERAPY        Paget's disease of left breast ([place])    Initial Diagnosis Paget's disease of left breast    PAST MEDICAL HISTORY: Past Medical History  Diagnosis Date  . Hypertension   . Thyroid disease   . Paget's disease of left breast     2015- radiation    PAST SURGICAL HISTORY: Past Surgical History  Procedure Laterality Date  . Cataract extraction      FAMILY HISTORY Family History  Problem Relation Age of Onset  . Breast cancer Mother 74    GYNECOLOGIC HISTORY:  No LMP recorded. Patient is postmenopausal.     ADVANCED DIRECTIVES:    HEALTH MAINTENANCE: Social History  Substance Use Topics  . Smoking status: Former Research scientist (life sciences)  . Smokeless tobacco: Not on file  . Alcohol Use: Not on file      Mammogram:June 2016  Allergies  Allergen Reactions  . Alendronate Sodium Other (See Comments)    Worsened acid reflux  . Ibandronic Acid Other (See Comments)    Worsened acid reflux  . Chlorhexidine Rash    Current Outpatient Prescriptions  Medication Sig Dispense Refill  . atorvastatin (LIPITOR) 20 MG tablet Take 20 mg by mouth daily. At bedtime    . Calcium Carb-Cholecalciferol 600-200 MG-UNIT TABS Take 1 tablet by mouth 3 (three) times daily.    Marland Kitchen denosumab (PROLIA) 60 MG/ML SOLN injection Inject 60 mg into the skin every 6 (six) months. Administer in upper arm, thigh, or abdomen once a year.    . hydrochlorothiazide (HYDRODIURIL) 25 MG tablet Take 25 mg by mouth daily.    Marland Kitchen  latanoprost (XALATAN) 0.005 % ophthalmic solution Place 1 drop into both eyes at bedtime.    Marland Kitchen levothyroxine (SYNTHROID, LEVOTHROID) 75 MCG tablet Take 75 mcg by mouth daily before breakfast.    . meclizine (ANTIVERT) 25 MG tablet Take 25 mg by mouth 3 (three) times daily as needed for dizziness.    Marland Kitchen omeprazole (PRILOSEC) 20 MG capsule Take 20 mg by  mouth daily.    . timolol (TIMOPTIC) 0.5 % ophthalmic solution     . traMADol (ULTRAM) 50 MG tablet Take by mouth every 4 (four) hours as needed for moderate pain.     No current facility-administered medications for this visit.    OBJECTIVE: There were no vitals taken for this visit.   There is no weight on file to calculate BMI.    ECOG FS:0 - Asymptomatic  General: Well-developed, well-nourished, no acute distress. Eyes: Pink conjunctiva, anicteric sclera. HEENT: Normocephalic, moist mucous membranes, clear oropharnyx. Lungs: Clear to auscultation bilaterally. Heart: Regular rate and rhythm. No rubs, murmurs, or gallops. Abdomen: Soft, nontender, nondistended. No organomegaly noted, normoactive bowel sounds. Breast: Breast palpated in a circular manner in the sitting and supine positions.  No masses or fullness palpated.  Axilla palpated in both positions with no masses or fullness palpated.  Musculoskeletal: No edema, cyanosis, or clubbing. Neuro: Alert, answering all questions appropriately. Cranial nerves grossly intact. Skin: No rashes or petechiae noted. Psych: Normal affect. Lymphatics: No cervical, clavicular, or axillary LAD.   LAB RESULTS:  No visits with results within 3 Day(s) from this visit. Latest known visit with results is:  Appointment on 06/22/2014  Component Date Value Ref Range Status  . WBC 06/22/2014 4.9  3.6 - 11.0 K/uL Final  . RBC 06/22/2014 4.11  3.80 - 5.20 MIL/uL Final  . Hemoglobin 06/22/2014 13.0  12.0 - 16.0 g/dL Final  . HCT 06/22/2014 38.6  35.0 - 47.0 % Final  . MCV 06/22/2014 93.9  80.0 - 100.0 fL Final  . MCH 06/22/2014 31.7  26.0 - 34.0 pg Final  . MCHC 06/22/2014 33.7  32.0 - 36.0 g/dL Final  . RDW 06/22/2014 13.8  11.5 - 14.5 % Final  . Platelets 06/22/2014 172  150 - 440 K/uL Final  . Neutrophils Relative % 06/22/2014 55   Final  . Neutro Abs 06/22/2014 2.7  1.4 - 6.5 K/uL Final  . Lymphocytes Relative 06/22/2014 30   Final  .  Lymphs Abs 06/22/2014 1.5  1.0 - 3.6 K/uL Final  . Monocytes Relative 06/22/2014 13   Final  . Monocytes Absolute 06/22/2014 0.6  0.2 - 0.9 K/uL Final  . Eosinophils Relative 06/22/2014 1   Final  . Eosinophils Absolute 06/22/2014 0.1  0 - 0.7 K/uL Final  . Basophils Relative 06/22/2014 1   Final  . Basophils Absolute 06/22/2014 0.0  0 - 0.1 K/uL Final  . Sodium 06/22/2014 138  135 - 145 mmol/L Final  . Potassium 06/22/2014 3.5  3.5 - 5.1 mmol/L Final  . Chloride 06/22/2014 101  101 - 111 mmol/L Final  . CO2 06/22/2014 29  22 - 32 mmol/L Final  . Glucose, Bld 06/22/2014 92  65 - 99 mg/dL Final  . BUN 06/22/2014 19  6 - 20 mg/dL Final  . Creatinine, Ser 06/22/2014 1.06* 0.44 - 1.00 mg/dL Final  . Calcium 06/22/2014 9.5  8.9 - 10.3 mg/dL Final  . Total Protein 06/22/2014 6.9  6.5 - 8.1 g/dL Final  . Albumin 06/22/2014 4.3  3.5 - 5.0 g/dL  Final  . AST 06/22/2014 22  15 - 41 U/L Final  . ALT 06/22/2014 23  14 - 54 U/L Final  . Alkaline Phosphatase 06/22/2014 40  38 - 126 U/L Final  . Total Bilirubin 06/22/2014 0.9  0.3 - 1.2 mg/dL Final  . GFR calc non Af Amer 06/22/2014 47* >60 mL/min Final  . GFR calc Af Amer 06/22/2014 54* >60 mL/min Final   Comment: (NOTE) The eGFR has been calculated using the CKD EPI equation. This calculation has not been validated in all clinical situations. eGFR's persistently <60 mL/min signify possible Chronic Kidney Disease.   . Anion gap 06/22/2014 8  5 - 15 Final    STUDIES: No results found.  ASSESSMENT:  Carcinoma of left breast involving nipple and areola complex, Paget's disease, stage 0.  PLAN:   1. Paget's disease of left breast. Patient is status post resection as well as radiation therapy. Clinically there is no evidence of recurrent disease. Patient had previously refused any anti-hormone therapy. Her most recent mammogram in June 2016 was reported as BI-RADS 2, benign. She is due for a mammogram in June 2017. Patient continues with routine  follow-up with radiation oncology as well as her surgeon, Dr. Rochel Brome.  We will continue with routine follow-up in approximately one year.  Patient expressed understanding and was in agreement with this plan. She also understands that She can call clinic at any time with any questions, concerns, or complaints.   Dr. Oliva Bustard was available for consultation and review of plan of care for this patient.   Evlyn Kanner, NP   06/22/2015 9:13 AM

## 2015-07-14 ENCOUNTER — Ambulatory Visit
Admission: RE | Admit: 2015-07-14 | Discharge: 2015-07-14 | Disposition: A | Discharge status: Home / Self Care | Payer: Medicare Other | Source: Ambulatory Visit | Attending: Radiation Oncology | Admitting: Radiation Oncology

## 2015-07-14 DIAGNOSIS — M889 Osteitis deformans of unspecified bone: Secondary | ICD-10-CM | POA: Diagnosis not present

## 2015-07-14 DIAGNOSIS — C50012 Malignant neoplasm of nipple and areola, left female breast: Secondary | ICD-10-CM

## 2015-07-14 DIAGNOSIS — Z853 Personal history of malignant neoplasm of breast: Secondary | ICD-10-CM | POA: Diagnosis present

## 2015-07-14 DIAGNOSIS — Z923 Personal history of irradiation: Secondary | ICD-10-CM | POA: Diagnosis not present

## 2016-06-20 ENCOUNTER — Ambulatory Visit: Payer: Medicare Other | Admitting: Radiation Oncology

## 2016-06-20 ENCOUNTER — Inpatient Hospital Stay: Admission: RE | Admit: 2016-06-20 | Payer: Medicare Other | Source: Ambulatory Visit | Admitting: Radiation Oncology

## 2016-06-21 ENCOUNTER — Inpatient Hospital Stay (HOSPITAL_BASED_OUTPATIENT_CLINIC_OR_DEPARTMENT_OTHER): Payer: Medicare Other | Admitting: Oncology

## 2016-06-21 ENCOUNTER — Other Ambulatory Visit: Payer: Self-pay | Admitting: *Deleted

## 2016-06-21 ENCOUNTER — Inpatient Hospital Stay: Payer: Medicare Other | Attending: Oncology

## 2016-06-21 ENCOUNTER — Encounter: Payer: Self-pay | Admitting: Radiation Oncology

## 2016-06-21 ENCOUNTER — Ambulatory Visit
Admission: RE | Admit: 2016-06-21 | Discharge: 2016-06-21 | Disposition: A | Discharge status: Home / Self Care | Payer: Medicare Other | Source: Ambulatory Visit | Attending: Radiation Oncology | Admitting: Radiation Oncology

## 2016-06-21 VITALS — BP 143/77 | HR 74 | Temp 96.3°F | Resp 20 | Wt 129.0 lb

## 2016-06-21 VITALS — BP 163/74 | HR 66 | Temp 96.1°F | Resp 18 | Wt 129.1 lb

## 2016-06-21 DIAGNOSIS — Z87891 Personal history of nicotine dependence: Secondary | ICD-10-CM

## 2016-06-21 DIAGNOSIS — Z853 Personal history of malignant neoplasm of breast: Secondary | ICD-10-CM | POA: Insufficient documentation

## 2016-06-21 DIAGNOSIS — E079 Disorder of thyroid, unspecified: Secondary | ICD-10-CM | POA: Insufficient documentation

## 2016-06-21 DIAGNOSIS — Z923 Personal history of irradiation: Secondary | ICD-10-CM

## 2016-06-21 DIAGNOSIS — Z79899 Other long term (current) drug therapy: Secondary | ICD-10-CM | POA: Diagnosis not present

## 2016-06-21 DIAGNOSIS — I10 Essential (primary) hypertension: Secondary | ICD-10-CM

## 2016-06-21 DIAGNOSIS — C50012 Malignant neoplasm of nipple and areola, left female breast: Secondary | ICD-10-CM

## 2016-06-21 LAB — CBC WITH DIFFERENTIAL/PLATELET
Basophils Absolute: 0 10*3/uL (ref 0–0.1)
Basophils Relative: 1 %
EOS ABS: 0.1 10*3/uL (ref 0–0.7)
EOS PCT: 2 %
HCT: 37 % (ref 35.0–47.0)
Hemoglobin: 13 g/dL (ref 12.0–16.0)
LYMPHS ABS: 1.9 10*3/uL (ref 1.0–3.6)
LYMPHS PCT: 36 %
MCH: 32 pg (ref 26.0–34.0)
MCHC: 35.2 g/dL (ref 32.0–36.0)
MCV: 90.9 fL (ref 80.0–100.0)
MONO ABS: 0.6 10*3/uL (ref 0.2–0.9)
Monocytes Relative: 11 %
Neutro Abs: 2.6 10*3/uL (ref 1.4–6.5)
Neutrophils Relative %: 50 %
PLATELETS: 178 10*3/uL (ref 150–440)
RBC: 4.07 MIL/uL (ref 3.80–5.20)
RDW: 14 % (ref 11.5–14.5)
WBC: 5.2 10*3/uL (ref 3.6–11.0)

## 2016-06-21 LAB — COMPREHENSIVE METABOLIC PANEL
ALT: 21 U/L (ref 14–54)
ANION GAP: 8 (ref 5–15)
AST: 23 U/L (ref 15–41)
Albumin: 4.4 g/dL (ref 3.5–5.0)
Alkaline Phosphatase: 43 U/L (ref 38–126)
BUN: 26 mg/dL — ABNORMAL HIGH (ref 6–20)
CHLORIDE: 106 mmol/L (ref 101–111)
CO2: 25 mmol/L (ref 22–32)
CREATININE: 1.2 mg/dL — AB (ref 0.44–1.00)
Calcium: 10 mg/dL (ref 8.9–10.3)
GFR, EST AFRICAN AMERICAN: 46 mL/min — AB (ref >60)
GFR, EST NON AFRICAN AMERICAN: 39 mL/min — AB (ref >60)
Glucose, Bld: 107 mg/dL — ABNORMAL HIGH (ref 65–99)
POTASSIUM: 3.5 mmol/L (ref 3.5–5.1)
Sodium: 139 mmol/L (ref 135–145)
Total Bilirubin: 1.1 mg/dL (ref 0.3–1.2)
Total Protein: 7.4 g/dL (ref 6.5–8.1)

## 2016-06-21 NOTE — Progress Notes (Signed)
Radiation Oncology Follow up Note  Name: Christine Owen   Date:   06/21/2016 MRN:  [id number] DOB: 1926-09-02    This 83 y.o. female presents to the clinic today for 2 a half year follow-up status post whole breast radiation to her left breast for Paget's disease.  REFERRING PROVIDER: Dion Body, MD  HPI: Patient is a 83 year old female now out 2 and half years having pleaded whole breast radiation to her left breast as was wide local excision for Paget's disease. She is seen today in routine follow-up and is doing well. She specifically denies breast tenderness cough or bone pain. She has been recommended for anti-estrogen therapy although has declined that.. Her last mammogram was in June 2017 BI-RADS 2 benign.  COMPLICATIONS OF TREATMENT: none  FOLLOW UP COMPLIANCE: keeps appointments   PHYSICAL EXAM:  BP (!) 143/77   Pulse 74   Temp (!) 96.3 F (35.7 C)   Resp 20   Wt 128 lb 15.5 oz (58.5 kg)   BMI 24.37 kg/m  Lungs are clear to A&P cardiac examination essentially unremarkable with regular rate and rhythm. No dominant mass or nodularity is noted in either breast in 2 positions examined. Incision is well-healed. No axillary or supraclavicular adenopathy is appreciated. Cosmetic result is excellent. She does have sacrifice of the nipple areolar complex. Well-developed well-nourished patient in NAD. HEENT reveals PERLA, EOMI, discs not visualized.  Oral cavity is clear. No oral mucosal lesions are identified. Neck is clear without evidence of cervical or supraclavicular adenopathy. Lungs are clear to A&P. Cardiac examination is essentially unremarkable with regular rate and rhythm without murmur rub or thrill. Abdomen is benign with no organomegaly or masses noted. Motor sensory and DTR levels are equal and symmetric in the upper and lower extremities. Cranial nerves II through XII are grossly intact. Proprioception is intact. No peripheral adenopathy or edema is identified. No  motor or sensory levels are noted. Crude visual fields are within normal range.  RADIOLOGY RESULTS: Mammograms from June reviewed and compatible above-stated findings. Mammograms have been ordered for June of this year.  PLAN: Present time patient is doing well with no evidence of disease. I'm please were overall progress. She has declined antiestrogen therapy. She scheduled for follow-up mammograms in June. I have asked to see her back in 1 year for follow-up. Patient knows to call with any concerns.  I would like to take this opportunity to thank you for allowing me to participate in the care of your patient.Armstead Peaks., MD

## 2016-06-21 NOTE — Progress Notes (Signed)
Here for follow up

## 2016-06-21 NOTE — Progress Notes (Signed)
Hematology/Oncology Consult note [facility]  [phone] [phone]  Patient Care Team: Dion Body, MD as PCP - General (Family Medicine)   Name of the patient: Christine Owen  [id number]  09/27/1926   Date of visit: 06/21/16  Diagnosis- pagets disease of left breast   Chief complaint/ Reason for visit- routine f/u  Heme/Onc history: Patient is a 83 year old female who was diagnosed with Paget's disease of the left breast in 2015. She is status post wide local excision with negative margins as well as whole breast radiation. She did not have any associated component of DCIS or invasive cancer. She has been following up on a yearly basis with [place] as well as Dr. Baruch Gouty  Interval history- doing well. Denies any complaints  Review of systems- Review of Systems  Constitutional: Negative for chills, fever, malaise/fatigue and weight loss.  HENT: Negative for congestion, ear discharge and nosebleeds.   Eyes: Negative for blurred vision.  Respiratory: Negative for cough, hemoptysis, sputum production, shortness of breath and wheezing.   Cardiovascular: Negative for chest pain, palpitations, orthopnea and claudication.  Gastrointestinal: Negative for abdominal pain, blood in stool, constipation, diarrhea, heartburn, melena, nausea and vomiting.  Genitourinary: Negative for dysuria, flank pain, frequency, hematuria and urgency.  Musculoskeletal: Negative for back pain, joint pain and myalgias.  Skin: Negative for rash.  Neurological: Negative for dizziness, tingling, focal weakness, seizures, weakness and headaches.  Endo/Heme/Allergies: Does not bruise/bleed easily.  Psychiatric/Behavioral: Negative for depression and suicidal ideas. The patient does not have insomnia.      Current treatment- observation  Allergies  Allergen Reactions  . Alendronate Sodium Other (See Comments)    Worsened acid reflux  . Ibandronic Acid Other  (See Comments)    Worsened acid reflux  . Chlorhexidine Rash     Past Medical History:  Diagnosis Date  . Hypertension   . Paget's disease of left breast ([place]) 2015   surgery, radiation tx finished 11/2013  . Thyroid disease      Past Surgical History:  Procedure Laterality Date  . BREAST BIOPSY Left 2015   pagets  . CATARACT EXTRACTION      Social History   Social History  . Marital status: Widowed    Spouse name: N/A  . Number of children: N/A  . Years of education: N/A   Occupational History  . Not on file.   Social History Main Topics  . Smoking status: Former Research scientist (life sciences)  . Smokeless tobacco: Never Used  . Alcohol use Not on file  . Drug use: Unknown  . Sexual activity: Not on file   Other Topics Concern  . Not on file   Social History Narrative  . No narrative on file    Family History  Problem Relation Age of Onset  . Breast cancer Mother 63     Current Outpatient Prescriptions:  .  atorvastatin (LIPITOR) 20 MG tablet, Take 20 mg by mouth daily. At bedtime, Disp: , Rfl:  .  Calcium Carb-Cholecalciferol 600-200 MG-UNIT TABS, Take 1 tablet by mouth 3 (three) times daily., Disp: , Rfl:  .  hydrochlorothiazide (HYDRODIURIL) 25 MG tablet, Take 25 mg by mouth daily., Disp: , Rfl:  .  levothyroxine (SYNTHROID, LEVOTHROID) 75 MCG tablet, Take 75 mcg by mouth daily before breakfast., Disp: , Rfl:  .  omeprazole (PRILOSEC) 20 MG capsule, Take 20 mg by mouth daily., Disp: , Rfl:  .  denosumab (PROLIA) 60 MG/ML SOLN injection, Inject 60 mg into the  skin every 6 (six) months. Administer in upper arm, thigh, or abdomen once a year., Disp: , Rfl:  .  meclizine (ANTIVERT) 25 MG tablet, Take 25 mg by mouth 3 (three) times daily as needed for dizziness. Reported on 06/22/2015, Disp: , Rfl:  .  traMADol (ULTRAM) 50 MG tablet, Take by mouth every 4 (four) hours as needed for moderate pain. Reported on 06/22/2015, Disp: , Rfl:   Physical exam:  Vitals:   06/21/16 1018    BP: (!) 163/74  Pulse: 66  Resp: 18  Temp: (!) 96.1 F (35.6 C)  TempSrc: Tympanic  Weight: 129 lb 1.6 oz (58.6 kg)   Physical Exam  Constitutional: She is oriented to person, place, and time and well-developed, well-nourished, and in no distress.  HENT:  Head: Normocephalic and atraumatic.  Eyes: EOM are normal. Pupils are equal, round, and reactive to light.  Neck: Normal range of motion.  Cardiovascular: Normal rate, regular rhythm and normal heart sounds.   Pulmonary/Chest: Effort normal and breath sounds normal.  Abdominal: Soft. Bowel sounds are normal.  Neurological: She is alert and oriented to person, place, and time.  Skin: Skin is warm and dry.   scar of prior left breast surgery seen. Nipple and areola absent from prior surgery. No palpable masses in either breast. No palpable adenopathy  CMP Latest Ref Rng & Units 06/21/2016  Glucose 65 - 99 mg/dL 107(H)  BUN 6 - 20 mg/dL 26(H)  Creatinine 0.44 - 1.00 mg/dL 1.20(H)  Sodium 135 - 145 mmol/L 139  Potassium 3.5 - 5.1 mmol/L 3.5  Chloride 101 - 111 mmol/L 106  CO2 22 - 32 mmol/L 25  Calcium 8.9 - 10.3 mg/dL 10.0  Total Protein 6.5 - 8.1 g/dL 7.4  Total Bilirubin 0.3 - 1.2 mg/dL 1.1  Alkaline Phos 38 - 126 U/L 43  AST 15 - 41 U/L 23  ALT 14 - 54 U/L 21   CBC Latest Ref Rng & Units 06/21/2016  WBC 3.6 - 11.0 K/uL 5.2  Hemoglobin 12.0 - 16.0 g/dL 13.0  Hematocrit 35.0 - 47.0 % 37.0  Platelets 150 - 440 K/uL 178     Assessment and plan- Patient is a 83 y.o. female with h/o left breast pagets disease in 2015  Clinically patient is doing well and there is no evidence of recurrence on today's exam. I would recommend yearly follow-up with Dr. Baruch Gouty at this point who also has been scheduling her mammograms. She need not f/u with medical oncology. She can referred in the future if questions or concerns arise   Visit Diagnosis 1. Paget's disease of left breast [facility])      Dr. Randa Evens, MD, MPH [facility] Pager- [phone] 06/21/2016 3:29 PM

## 2016-08-03 ENCOUNTER — Ambulatory Visit
Admission: RE | Admit: 2016-08-03 | Discharge: 2016-08-03 | Disposition: A | Discharge status: Home / Self Care | Payer: Medicare Other | Source: Ambulatory Visit | Attending: Radiation Oncology | Admitting: Radiation Oncology

## 2016-08-03 DIAGNOSIS — C50012 Malignant neoplasm of nipple and areola, left female breast: Secondary | ICD-10-CM

## 2016-08-03 DIAGNOSIS — Z08 Encounter for follow-up examination after completed treatment for malignant neoplasm: Secondary | ICD-10-CM | POA: Diagnosis not present

## 2016-08-03 DIAGNOSIS — Z853 Personal history of malignant neoplasm of breast: Secondary | ICD-10-CM | POA: Diagnosis present

## 2017-07-11 ENCOUNTER — Ambulatory Visit: Payer: Medicare Other | Admitting: Radiation Oncology

## 2017-07-12 ENCOUNTER — Encounter: Payer: Self-pay | Admitting: Radiation Oncology

## 2017-07-12 ENCOUNTER — Ambulatory Visit
Admission: RE | Admit: 2017-07-12 | Discharge: 2017-07-12 | Disposition: A | Discharge status: Home / Self Care | Payer: Medicare Other | Source: Ambulatory Visit | Attending: Radiation Oncology | Admitting: Radiation Oncology

## 2017-07-12 ENCOUNTER — Other Ambulatory Visit: Payer: Self-pay

## 2017-07-12 ENCOUNTER — Other Ambulatory Visit: Payer: Self-pay | Admitting: *Deleted

## 2017-07-12 VITALS — BP 130/75 | HR 79 | Temp 96.6°F | Wt 122.9 lb

## 2017-07-12 DIAGNOSIS — Z853 Personal history of malignant neoplasm of breast: Secondary | ICD-10-CM | POA: Diagnosis present

## 2017-07-12 DIAGNOSIS — Z923 Personal history of irradiation: Secondary | ICD-10-CM | POA: Insufficient documentation

## 2017-07-12 DIAGNOSIS — C50012 Malignant neoplasm of nipple and areola, left female breast: Secondary | ICD-10-CM

## 2017-07-12 NOTE — Progress Notes (Signed)
Radiation Oncology Follow up Note  Name: Christine Owen   Date:   07/12/2017 MRN:  [id number] DOB: [birth year]    This 84 y.o. female presents to the clinic today for 3.5 year follow-up status post.hole breast radiation to her left breast for Paget's disease  REFERRING PROVIDER: Dion Body, MD  HPI: patient is a 84 year old female now out 3.5 years having completed whole breast radiation to her left breast for Paget's disease. She is seen today in routine follow-up and is doing well..last mammogram was back in June was a BI-RADS 2 benign which I have reviewed. She specifically denies breast tenderness cough or bone pain. Patient is not on antiestrogen therapy.  COMPLICATIONS OF TREATMENT: none  FOLLOW UP COMPLIANCE: keeps appointments   PHYSICAL EXAM:  BP 130/75   Pulse 79   Temp (!) 96.6 F (35.9 C)   Wt 122 lb 14.5 oz (55.7 kg)   BMI 23.22 kg/m  Lungs are clear to A&P cardiac examination essentially unremarkable with regular rate and rhythm. No dominant mass or nodularity is noted in either breast in 2 positions examined. Incision is well-healed. No axillary or supraclavicular adenopathy is appreciated. Cosmetic result is excellent. Well-developed well-nourished patient in NAD. HEENT reveals PERLA, EOMI, discs not visualized.  Oral cavity is clear. No oral mucosal lesions are identified. Neck is clear without evidence of cervical or supraclavicular adenopathy. Lungs are clear to A&P. Cardiac examination is essentially unremarkable with regular rate and rhythm without murmur rub or thrill. Abdomen is benign with no organomegaly or masses noted. Motor sensory and DTR levels are equal and symmetric in the upper and lower extremities. Cranial nerves II through XII are grossly intact. Proprioception is intact. No peripheral adenopathy or edema is identified. No motor or sensory levels are noted. Crude visual fields are within normal range.  RADIOLOGY RESULTS: previous mammogram  reviewed and compatible with the above-stated findings  PLAN: present time patient is doing well with no evidence of disease. I have ordered mammograms bilateral diagnostic on the patient. I've asked to see her back in 1 year for follow-up. Patient is to call sooner with any concerns.  I would like to take this opportunity to thank you for allowing me to participate in the care of your patient.Noreene Filbert, MD

## 2017-08-05 ENCOUNTER — Other Ambulatory Visit: Payer: Medicare Other

## 2017-09-19 ENCOUNTER — Ambulatory Visit
Admission: RE | Admit: 2017-09-19 | Discharge: 2017-09-19 | Disposition: A | Discharge status: Home / Self Care | Payer: Medicare Other | Source: Ambulatory Visit | Attending: Radiation Oncology | Admitting: Radiation Oncology

## 2017-09-19 DIAGNOSIS — C50012 Malignant neoplasm of nipple and areola, left female breast: Secondary | ICD-10-CM

## 2017-09-19 HISTORY — DX: Personal history of irradiation: Z92.3

## 2018-07-24 ENCOUNTER — Other Ambulatory Visit: Payer: Self-pay

## 2018-07-25 ENCOUNTER — Other Ambulatory Visit: Payer: Self-pay | Admitting: *Deleted

## 2018-07-25 ENCOUNTER — Other Ambulatory Visit: Payer: Self-pay

## 2018-07-25 ENCOUNTER — Ambulatory Visit
Admission: RE | Admit: 2018-07-25 | Discharge: 2018-07-25 | Disposition: A | Discharge status: Home / Self Care | Payer: Medicare Other | Source: Ambulatory Visit | Attending: Radiation Oncology | Admitting: Radiation Oncology

## 2018-07-25 ENCOUNTER — Encounter: Payer: Self-pay | Admitting: Radiation Oncology

## 2018-07-25 VITALS — BP 141/78 | HR 87 | Temp 98.7°F | Resp 18 | Wt 128.5 lb

## 2018-07-25 DIAGNOSIS — Z923 Personal history of irradiation: Secondary | ICD-10-CM | POA: Insufficient documentation

## 2018-07-25 DIAGNOSIS — C50012 Malignant neoplasm of nipple and areola, left female breast: Secondary | ICD-10-CM

## 2018-07-25 DIAGNOSIS — Z853 Personal history of malignant neoplasm of breast: Secondary | ICD-10-CM | POA: Diagnosis not present

## 2018-07-25 NOTE — Progress Notes (Signed)
Radiation Oncology Follow up Note  Name: Christine Owen   Date:   07/25/2018 MRN:  [id number] DOB: 1926/10/11    This 85 y.o. female presents to the clinic today for 4-1/2-year follow-up status post whole breast radiation to her left breast for Paget's disease.  REFERRING PROVIDER: Dion Body, MD  HPI: Patient is a 85 year old female now seen now 33 and half years having completed whole breast radiation to her left breast for Paget's disease seen today in routine follow-up she is doing well.  She specifically denies breast tenderness cough or bone pain.  I have she is not had a mammogram since last August I have scheduled another mammogram none in August..  Patient is not on antiestrogen therapy.  Her last mammogram which I have reviewed was BI-RADS 2 benign.  COMPLICATIONS OF TREATMENT: none  FOLLOW UP COMPLIANCE: keeps appointments   PHYSICAL EXAM:  BP (!) 141/78 (BP Location: Left Arm, Patient Position: Sitting)   Pulse 87   Temp 98.7 F (37.1 C) (Tympanic)   Resp 18   Wt 128 lb 8.5 oz (58.3 kg)   BMI 24.29 kg/m  Lungs are clear to A&P cardiac examination essentially unremarkable with regular rate and rhythm. No dominant mass or nodularity is noted in either breast in 2 positions examined. Incision is well-healed. No axillary or supraclavicular adenopathy is appreciated. Cosmetic result is excellent.  Well-developed well-nourished patient in NAD. HEENT reveals PERLA, EOMI, discs not visualized.  Oral cavity is clear. No oral mucosal lesions are identified. Neck is clear without evidence of cervical or supraclavicular adenopathy. Lungs are clear to A&P. Cardiac examination is essentially unremarkable with regular rate and rhythm without murmur rub or thrill. Abdomen is benign with no organomegaly or masses noted. Motor sensory and DTR levels are equal and symmetric in the upper and lower extremities. Cranial nerves II through XII are grossly intact. Proprioception is intact. No  peripheral adenopathy or edema is identified. No motor or sensory levels are noted. Crude visual fields are within normal range.  RADIOLOGY RESULTS: Mammograms reviewed and compatible with above-stated findings  PLAN: Present time patient is doing well with no evidence of disease close to 5 years out.  At this time I am going to discontinue follow-up care.  I have ordered mammograms for August which I will review.  Patient knows to call at any time her follow-up care will be done by her primary care provider.  Patient is to call at anytime with any concerns.  I would like to take this opportunity to thank you for allowing me to participate in the care of your patient.Noreene Filbert, MD

## 2018-09-23 ENCOUNTER — Other Ambulatory Visit: Payer: Self-pay

## 2018-09-23 ENCOUNTER — Ambulatory Visit
Admission: RE | Admit: 2018-09-23 | Discharge: 2018-09-23 | Disposition: A | Discharge status: Home / Self Care | Payer: Medicare Other | Source: Ambulatory Visit | Attending: Radiation Oncology | Admitting: Radiation Oncology

## 2018-09-23 DIAGNOSIS — C50012 Malignant neoplasm of nipple and areola, left female breast: Secondary | ICD-10-CM | POA: Diagnosis present

## 2020-05-11 ENCOUNTER — Emergency Department: Payer: Medicare Other

## 2020-05-11 ENCOUNTER — Emergency Department
Admission: EM | Admit: 2020-05-11 | Discharge: 2020-05-11 | Disposition: A | Discharge status: Home / Self Care | Payer: Medicare Other | Attending: Emergency Medicine | Admitting: Emergency Medicine

## 2020-05-11 ENCOUNTER — Other Ambulatory Visit: Payer: Self-pay

## 2020-05-11 DIAGNOSIS — R42 Dizziness and giddiness: Secondary | ICD-10-CM | POA: Diagnosis not present

## 2020-05-11 DIAGNOSIS — R519 Headache, unspecified: Secondary | ICD-10-CM | POA: Insufficient documentation

## 2020-05-11 DIAGNOSIS — E039 Hypothyroidism, unspecified: Secondary | ICD-10-CM | POA: Diagnosis not present

## 2020-05-11 DIAGNOSIS — I10 Essential (primary) hypertension: Secondary | ICD-10-CM | POA: Insufficient documentation

## 2020-05-11 DIAGNOSIS — Z79899 Other long term (current) drug therapy: Secondary | ICD-10-CM | POA: Insufficient documentation

## 2020-05-11 DIAGNOSIS — R110 Nausea: Secondary | ICD-10-CM | POA: Diagnosis not present

## 2020-05-11 DIAGNOSIS — Z87891 Personal history of nicotine dependence: Secondary | ICD-10-CM | POA: Diagnosis not present

## 2020-05-11 LAB — URINALYSIS, COMPLETE (UACMP) WITH MICROSCOPIC
Bacteria, UA: NONE SEEN
Bilirubin Urine: NEGATIVE
Glucose, UA: NEGATIVE mg/dL
Hgb urine dipstick: NEGATIVE
Ketones, ur: NEGATIVE mg/dL
Leukocytes,Ua: NEGATIVE
Nitrite: NEGATIVE
Protein, ur: NEGATIVE mg/dL
Specific Gravity, Urine: 1.009 (ref 1.005–1.030)
pH: 5 (ref 5.0–8.0)

## 2020-05-11 LAB — CBC
HCT: 35.1 % — ABNORMAL LOW (ref 36.0–46.0)
Hemoglobin: 11.9 g/dL — ABNORMAL LOW (ref 12.0–15.0)
MCH: 31.4 pg (ref 26.0–34.0)
MCHC: 33.9 g/dL (ref 30.0–36.0)
MCV: 92.6 fL (ref 80.0–100.0)
Platelets: 151 10*3/uL (ref 150–400)
RBC: 3.79 MIL/uL — ABNORMAL LOW (ref 3.87–5.11)
RDW: 13.3 % (ref 11.5–15.5)
WBC: 7.1 10*3/uL (ref 4.0–10.5)
nRBC: 0 % (ref 0.0–0.2)

## 2020-05-11 LAB — BASIC METABOLIC PANEL
Anion gap: 7 (ref 5–15)
BUN: 30 mg/dL — ABNORMAL HIGH (ref 8–23)
CO2: 26 mmol/L (ref 22–32)
Calcium: 9.8 mg/dL (ref 8.9–10.3)
Chloride: 105 mmol/L (ref 98–111)
Creatinine, Ser: 1.42 mg/dL — ABNORMAL HIGH (ref 0.44–1.00)
GFR, Estimated: 35 mL/min — ABNORMAL LOW (ref >60)
Glucose, Bld: 142 mg/dL — ABNORMAL HIGH (ref 70–99)
Potassium: 4.1 mmol/L (ref 3.5–5.1)
Sodium: 138 mmol/L (ref 135–145)

## 2020-05-11 LAB — MAGNESIUM: Magnesium: 1.6 mg/dL — ABNORMAL LOW (ref 1.7–2.4)

## 2020-05-11 LAB — TROPONIN I (HIGH SENSITIVITY): Troponin I (High Sensitivity): 5 ng/L (ref <18)

## 2020-05-11 LAB — TSH: TSH: 0.604 u[IU]/mL (ref 0.350–4.500)

## 2020-05-11 MED ORDER — LACTATED RINGERS IV BOLUS
500.0000 mL | Freq: Once | INTRAVENOUS | Status: AC
  Administered 2020-05-11: 500 mL via INTRAVENOUS

## 2020-05-11 MED ORDER — MAGNESIUM SULFATE 2 GM/50ML IV SOLN
2.0000 g | Freq: Once | INTRAVENOUS | Status: AC
  Administered 2020-05-11: 2 g via INTRAVENOUS
  Filled 2020-05-11: qty 50

## 2020-05-11 NOTE — ED Notes (Signed)
Patient returned to ED from MRI. Son at bedside.

## 2020-05-11 NOTE — ED Notes (Signed)
Pt taken to xray and CT before repeat VS could be taken. This nurse was providing direct care to critical ICU pt.

## 2020-05-11 NOTE — ED Notes (Signed)
Family updated as to patient's status.

## 2020-05-11 NOTE — ED Provider Notes (Signed)
[facility] Emergency Department Provider Note  ____________________________________________   Event Date/Time   First MD Initiated Contact with Patient 05/11/20 1611     (approximate)  I have reviewed the triage vital signs and the nursing notes.   HISTORY  Chief Complaint Dizziness   HPI Christine Owen is a 87 y.o. female with past medical history of vertigo, HTN, Paget's disease status post radiation and thyroid disease who presents for assessment of some dizziness that she noticed this morning upon waking around 7 or 8 AM.  Patient states this is very different than her usual vertigo dizziness.  She also states she is a little frontal head pressure and nausea.  No prior similar episodes.  No clear precipitating alleviating or aggravating factors.  She states she is otherwise been in her usual state health without any recent fevers, chills, cough, chest pain, abdominal pain, back pain, vomiting, diarrhea, dysuria, rash or recent falls or injuries.  She has not had any difficulty ambulating today.  She "just does not feel right"         Past Medical History:  Diagnosis Date  . Hypertension   . Paget's disease of left breast ([place]) 2015   surgery, radiation tx finished 11/2013  . Personal history of radiation therapy   . Thyroid disease     Patient Active Problem List   Diagnosis Date Noted  . Acquired hypothyroidism 06/27/2014  . Borderline diabetes 06/27/2014  . C2 cervical fracture ([place]) 06/27/2014  . Acid reflux 06/27/2014  . Glaucoma 06/27/2014  . Fracture of sacrum ([place]) 06/27/2014  . Closed fracture of dorsal (thoracic) vertebra ([place]) 06/27/2014  . Dorsal (thoracic) vertebral fracture ([place]) 06/27/2014  . Paget's disease of left breast [facility])     Past Surgical History:  Procedure Laterality Date  . BREAST BIOPSY Left 2015   pagets  . BREAST LUMPECTOMY Left 2015  . CATARACT EXTRACTION      Prior to Admission medications    Medication Sig Start Date End Date Taking? Authorizing Provider  atorvastatin (LIPITOR) 40 MG tablet TAKE 1 TABLET BY MOUTH ONCE EVERY EVENING 01/21/17   [provider]  Calcium Carb-Cholecalciferol 600-200 MG-UNIT TABS Take 1 tablet by mouth 3 (three) times daily.    [provider]  denosumab (PROLIA) 60 MG/ML SOLN injection Inject 60 mg into the skin every 6 (six) months. Administer in upper arm, thigh, or abdomen once a year.    [provider]  hydrochlorothiazide (HYDRODIURIL) 25 MG tablet Take 25 mg by mouth daily.    [provider]  levothyroxine (SYNTHROID, LEVOTHROID) 75 MCG tablet Take 75 mcg by mouth daily before breakfast.    [provider]  meclizine (ANTIVERT) 25 MG tablet Take 25 mg by mouth 3 (three) times daily as needed for dizziness. Reported on 06/22/2015    [provider]  omeprazole (PRILOSEC) 20 MG capsule Take 20 mg by mouth daily.    [provider]  traMADol (ULTRAM) 50 MG tablet Take by mouth every 4 (four) hours as needed for moderate pain. Reported on 06/22/2015    [provider]    Allergies Alendronate sodium, Ibandronic acid, and Chlorhexidine  Family History  Problem Relation Age of Onset  . Breast cancer Mother 57    Social History Social History   Tobacco Use  . Smoking status: Former Research scientist (life sciences)  . Smokeless tobacco: Never Used  Substance Use Topics  . Alcohol use: Never    Alcohol/week: 0.0 standard drinks  Review of Systems  Review of Systems  Constitutional: Negative for chills and fever.  HENT: Negative for sore throat.   Eyes: Negative for pain.  Respiratory: Negative for cough and stridor.   Cardiovascular: Negative for chest pain.  Gastrointestinal: Positive for nausea. Negative for vomiting.  Genitourinary: Negative for dysuria.  Musculoskeletal: Negative for myalgias.  Skin: Negative for rash.  Neurological: Positive for dizziness and headaches. Negative  for seizures and loss of consciousness.  Psychiatric/Behavioral: Negative for suicidal ideas.  All other systems reviewed and are negative.     ____________________________________________   PHYSICAL EXAM:  VITAL SIGNS: ED Triage Vitals  Enc Vitals Group     BP 05/11/20 1234 133/78     Pulse Rate 05/11/20 1234 67     Resp 05/11/20 1234 16     Temp 05/11/20 1234 98.4 F (36.9 C)     Temp Source 05/11/20 1234 Oral     SpO2 05/11/20 1234 98 %     Weight 05/11/20 1235 123 lb (55.8 kg)     Height 05/11/20 1235 5\' 1"  (1.549 m)     Head Circumference --      Peak Flow --      Pain Score 05/11/20 1236 0     Pain Loc --      Pain Edu? --      Excl. in [place]? --    Vitals:   05/11/20 1944 05/11/20 2000  BP: (!) 158/67 (!) 152/65  Pulse: 63 (!) 59  Resp: 16 15  Temp:    SpO2: 100% 99%   Physical Exam Vitals and nursing note reviewed.  Constitutional:      General: She is not in acute distress.    Appearance: She is well-developed.  HENT:     Head: Normocephalic and atraumatic.  Eyes:     Conjunctiva/sclera: Conjunctivae normal.  Cardiovascular:     Rate and Rhythm: Normal rate and regular rhythm.     Heart sounds: No murmur heard.   Pulmonary:     Effort: Pulmonary effort is normal. No respiratory distress.     Breath sounds: Normal breath sounds.  Abdominal:     Palpations: Abdomen is soft.     Tenderness: There is no abdominal tenderness.  Musculoskeletal:     Cervical back: Neck supple.  Skin:    General: Skin is warm and dry.  Neurological:     Mental Status: She is alert.     Cranial nerves II through XII grossly intact.  No pronator drift.  No finger dysmetria.  Symmetric 5/5 strength of all extremities.  Sensation intact to light touch in all extremities.  Unremarkable unassisted gait.   ____________________________________________   LABS (all labs ordered are listed, but only abnormal results are displayed)  Labs Reviewed  BASIC METABOLIC PANEL -  Abnormal; Notable for the following components:      Result Value   Glucose, Bld 142 (*)    BUN 30 (*)    Creatinine, Ser 1.42 (*)    GFR, Estimated 35 (*)    All other components within normal limits  CBC - Abnormal; Notable for the following components:   RBC 3.79 (*)    Hemoglobin 11.9 (*)    HCT 35.1 (*)    All other components within normal limits  URINALYSIS, COMPLETE (UACMP) WITH MICROSCOPIC - Abnormal; Notable for the following components:   Color, Urine STRAW (*)    APPearance CLEAR (*)    All other components within normal limits  MAGNESIUM - Abnormal; Notable for the following components:   Magnesium 1.6 (*)    All other components within normal limits  TSH  TROPONIN I (HIGH SENSITIVITY)   ____________________________________________  EKG  Sinus rhythm with PVC, rate of 65, normal axis, unremarkable intervals no clear evidence of acute ischemia or significant underlying arrhythmia there is a nonspecific change in T waves in lead III. ____________________________________________  RADIOLOGY  ED MD interpretation: Chest x-ray is unremarkable.  MR brain is unremarkable for any acute abnormalities.  Official radiology report(s): DG Chest 2 View  Result Date: 05/11/2020 CLINICAL DATA:  Dizziness EXAM: CHEST - 2 VIEW COMPARISON:  None. FINDINGS: The heart size and mediastinal contours are within normal limits. Aortic knob calcifications are seen. Both lungs are clear. The visualized skeletal structures are unremarkable. IMPRESSION: No active cardiopulmonary disease. Electronically Signed   By: Prudencio Pair M.D.   On: 05/11/2020 17:16   MR BRAIN WO CONTRAST  Result Date: 05/11/2020 CLINICAL DATA:  Dizziness on awakening. EXAM: MRI HEAD WITHOUT CONTRAST TECHNIQUE: Multiplanar, multiecho pulse sequences of the brain and surrounding structures were obtained without intravenous contrast. COMPARISON:  None. FINDINGS: Brain: No acute infarct, mass, midline shift, or extra-axial  fluid collection is identified. Patchy T2 hyperintensities in the cerebral white matter and pons are nonspecific but compatible with moderate chronic small vessel ischemic disease. Chronic microhemorrhages are noted in the left thalamus and adjacent to the atrium of the right lateral ventricle. There is also a small chronic hemorrhage involving the left external capsule. Dilated perivascular spaces are noted throughout the deep gray nuclei. Generalized cerebral atrophy is within normal limits for age. Vascular: Major intracranial vascular flow voids are preserved. Skull and upper cervical spine: Unremarkable bone marrow signal para Sinuses/Orbits: Bilateral cataract extraction. Clear paranasal sinuses. Small right mastoid effusion. Other: 7 mm cyst posteriorly in the nasopharynx centered slightly right of midline. IMPRESSION: 1. No acute intracranial abnormality. 2. Moderate chronic small vessel ischemic disease. Electronically Signed   By: Logan Bores M.D.   On: 05/11/2020 19:42    ____________________________________________   PROCEDURES  Procedure(s) performed (including Critical Care):  .1-3 Lead EKG Interpretation Performed by: Lucrezia Starch, MD Authorized by: Lucrezia Starch, MD     Interpretation: normal     ECG rate assessment: normal     Rhythm: sinus rhythm     Ectopy: none     Conduction: normal       ____________________________________________   INITIAL IMPRESSION / ASSESSMENT AND PLAN / ED COURSE      Patient presents with above to history exam for some dizziness and a little bit of head discomfort nausea that she states feels different than her typical vertigo symptoms.  She is afebrile hemodynamically stable on arrival.  She is nonfocal neuro exam.  Differential includes CVA possibly involving a posterior circulation, peripheral vertigo, metabolic derangements, acute infectious process, symptomatic arrhythmia and ACS.  ECG shows no clear significant arrhythmia  and whether some nonspecific changes given nonelevated troponin obtained.  3 hours after symptom onset Evalose patient for ACS.  CBC shows no leukocytosis or acute significant anemia.  BMP shows slightly decreased creatinine from baseline at 1.42 compared to 1.23 years ago with no other significant derangements.  Magnesium is low at 1.6.  TSH is unremarkable and urine does not appear infected.  Given concern for possible cerebellar stroke not clearly identified on exam MR brain obtained which does not show any evidence of this.  After receiving some IV fluids magnesium  patient states he felt much better.  Given stable vitals with reassuring exam work-up patient stating she felt better I think she is safe for discharge plan for continued outpatient evaluation.  Patient discharged stable condition.  Strict return precautions advised and discussed.      ____________________________________________   FINAL CLINICAL IMPRESSION(S) / ED DIAGNOSES  Final diagnoses:  Dizziness  Nonintractable headache, unspecified chronicity pattern, unspecified headache type  Nausea  Hypomagnesemia    Medications  lactated ringers bolus 500 mL (0 mLs Intravenous Stopped 05/11/20 1818)  magnesium sulfate IVPB 2 g 50 mL (0 g Intravenous Stopping Infusion hung by another clincian 05/11/20 1915)     ED Discharge Orders    None       Note:  This document was prepared using Dragon voice recognition software and may include unintentional dictation errors.   Lucrezia Starch, MD 05/11/20 [phone]

## 2020-05-11 NOTE — ED Notes (Signed)
Patient verbalized understanding of discharge instructions. Patient denies questions, or concerns.

## 2020-05-11 NOTE — ED Notes (Signed)
Patient to MRI.

## 2020-05-11 NOTE — ED Notes (Signed)
Patient is resting comfortably. 

## 2020-05-11 NOTE — ED Triage Notes (Addendum)
Pt to ER via POV with complaints of dizziness on awakening. Denies difficulty with ambulation or unilateral weakness, no chest pain or SHOB.   Hx of vertigo but states this feels different.

## 2020-05-11 NOTE — ED Notes (Signed)
Provided phone to pt for MRI screener.

## 2020-05-11 NOTE — ED Notes (Signed)
ED Provider at bedside. 

## 2021-09-16 ENCOUNTER — Emergency Department: Payer: Medicare Other

## 2021-09-16 ENCOUNTER — Encounter: Payer: Self-pay | Admitting: Intensive Care

## 2021-09-16 ENCOUNTER — Other Ambulatory Visit: Payer: Self-pay

## 2021-09-16 ENCOUNTER — Inpatient Hospital Stay
Admission: EM | Admit: 2021-09-16 | Discharge: 2021-09-19 | DRG: 536 | Disposition: A | Discharge status: Skilled Nursing Facility | Payer: Medicare Other | Attending: Internal Medicine | Admitting: Internal Medicine

## 2021-09-16 DIAGNOSIS — W19XXXA Unspecified fall, initial encounter: Secondary | ICD-10-CM | POA: Diagnosis not present

## 2021-09-16 DIAGNOSIS — Z888 Allergy status to other drugs, medicaments and biological substances status: Secondary | ICD-10-CM

## 2021-09-16 DIAGNOSIS — E876 Hypokalemia: Secondary | ICD-10-CM | POA: Diagnosis present

## 2021-09-16 DIAGNOSIS — Z87891 Personal history of nicotine dependence: Secondary | ICD-10-CM

## 2021-09-16 DIAGNOSIS — Z853 Personal history of malignant neoplasm of breast: Secondary | ICD-10-CM | POA: Diagnosis not present

## 2021-09-16 DIAGNOSIS — S32401A Unspecified fracture of right acetabulum, initial encounter for closed fracture: Secondary | ICD-10-CM

## 2021-09-16 DIAGNOSIS — S32411A Displaced fracture of anterior wall of right acetabulum, initial encounter for closed fracture: Principal | ICD-10-CM | POA: Diagnosis present

## 2021-09-16 DIAGNOSIS — S42141A Displaced fracture of glenoid cavity of scapula, right shoulder, initial encounter for closed fracture: Secondary | ICD-10-CM | POA: Diagnosis present

## 2021-09-16 DIAGNOSIS — Z79890 Hormone replacement therapy (postmenopausal): Secondary | ICD-10-CM | POA: Diagnosis not present

## 2021-09-16 DIAGNOSIS — S42101A Fracture of unspecified part of scapula, right shoulder, initial encounter for closed fracture: Secondary | ICD-10-CM

## 2021-09-16 DIAGNOSIS — S32591A Other specified fracture of right pubis, initial encounter for closed fracture: Secondary | ICD-10-CM | POA: Diagnosis present

## 2021-09-16 DIAGNOSIS — Z79899 Other long term (current) drug therapy: Secondary | ICD-10-CM

## 2021-09-16 DIAGNOSIS — I10 Essential (primary) hypertension: Secondary | ICD-10-CM

## 2021-09-16 DIAGNOSIS — E785 Hyperlipidemia, unspecified: Secondary | ICD-10-CM | POA: Diagnosis present

## 2021-09-16 DIAGNOSIS — E039 Hypothyroidism, unspecified: Secondary | ICD-10-CM | POA: Diagnosis present

## 2021-09-16 DIAGNOSIS — S42111A Displaced fracture of body of scapula, right shoulder, initial encounter for closed fracture: Secondary | ICD-10-CM

## 2021-09-16 DIAGNOSIS — K219 Gastro-esophageal reflux disease without esophagitis: Secondary | ICD-10-CM | POA: Diagnosis present

## 2021-09-16 DIAGNOSIS — W182XXA Fall in (into) shower or empty bathtub, initial encounter: Secondary | ICD-10-CM | POA: Diagnosis present

## 2021-09-16 DIAGNOSIS — I129 Hypertensive chronic kidney disease with stage 1 through stage 4 chronic kidney disease, or unspecified chronic kidney disease: Secondary | ICD-10-CM | POA: Diagnosis present

## 2021-09-16 DIAGNOSIS — Z66 Do not resuscitate: Secondary | ICD-10-CM | POA: Diagnosis present

## 2021-09-16 DIAGNOSIS — N1832 Chronic kidney disease, stage 3b: Secondary | ICD-10-CM | POA: Diagnosis present

## 2021-09-16 DIAGNOSIS — Z923 Personal history of irradiation: Secondary | ICD-10-CM | POA: Diagnosis not present

## 2021-09-16 DIAGNOSIS — S79911A Unspecified injury of right hip, initial encounter: Secondary | ICD-10-CM | POA: Diagnosis present

## 2021-09-16 DIAGNOSIS — Z803 Family history of malignant neoplasm of breast: Secondary | ICD-10-CM | POA: Diagnosis not present

## 2021-09-16 DIAGNOSIS — Y92012 Bathroom of single-family (private) house as the place of occurrence of the external cause: Secondary | ICD-10-CM | POA: Diagnosis not present

## 2021-09-16 LAB — COMPREHENSIVE METABOLIC PANEL
ALT: 22 U/L (ref 0–44)
AST: 26 U/L (ref 15–41)
Albumin: 4.2 g/dL (ref 3.5–5.0)
Alkaline Phosphatase: 45 U/L (ref 38–126)
Anion gap: 9 (ref 5–15)
BUN: 30 mg/dL — ABNORMAL HIGH (ref 8–23)
CO2: 21 mmol/L — ABNORMAL LOW (ref 22–32)
Calcium: 9.7 mg/dL (ref 8.9–10.3)
Chloride: 108 mmol/L (ref 98–111)
Creatinine, Ser: 1.46 mg/dL — ABNORMAL HIGH (ref 0.44–1.00)
GFR, Estimated: 34 mL/min — ABNORMAL LOW (ref >60)
Glucose, Bld: 161 mg/dL — ABNORMAL HIGH (ref 70–99)
Potassium: 3.2 mmol/L — ABNORMAL LOW (ref 3.5–5.1)
Sodium: 138 mmol/L (ref 135–145)
Total Bilirubin: 1.1 mg/dL (ref 0.3–1.2)
Total Protein: 7 g/dL (ref 6.5–8.1)

## 2021-09-16 LAB — CBC WITH DIFFERENTIAL/PLATELET
Abs Immature Granulocytes: 0.17 10*3/uL — ABNORMAL HIGH (ref 0.00–0.07)
Basophils Absolute: 0.1 10*3/uL (ref 0.0–0.1)
Basophils Relative: 0 %
Eosinophils Absolute: 0 10*3/uL (ref 0.0–0.5)
Eosinophils Relative: 0 %
HCT: 36.3 % (ref 36.0–46.0)
Hemoglobin: 12.1 g/dL (ref 12.0–15.0)
Immature Granulocytes: 1 %
Lymphocytes Relative: 7 %
Lymphs Abs: 1.2 10*3/uL (ref 0.7–4.0)
MCH: 31.2 pg (ref 26.0–34.0)
MCHC: 33.3 g/dL (ref 30.0–36.0)
MCV: 93.6 fL (ref 80.0–100.0)
Monocytes Absolute: 1 10*3/uL (ref 0.1–1.0)
Monocytes Relative: 6 %
Neutro Abs: 13.8 10*3/uL — ABNORMAL HIGH (ref 1.7–7.7)
Neutrophils Relative %: 86 %
Platelets: 125 10*3/uL — ABNORMAL LOW (ref 150–400)
RBC: 3.88 MIL/uL (ref 3.87–5.11)
RDW: 13.2 % (ref 11.5–15.5)
WBC: 16.2 10*3/uL — ABNORMAL HIGH (ref 4.0–10.5)
nRBC: 0 % (ref 0.0–0.2)

## 2021-09-16 MED ORDER — ENOXAPARIN SODIUM 30 MG/0.3ML IJ SOSY
30.0000 mg | PREFILLED_SYRINGE | INTRAMUSCULAR | Status: DC
Start: 2021-09-17 — End: 2021-09-19
  Administered 2021-09-17 – 2021-09-19 (×3): 30 mg via SUBCUTANEOUS
  Filled 2021-09-16 (×3): qty 0.3

## 2021-09-16 MED ORDER — ACETAMINOPHEN 650 MG RE SUPP: 650.0000 mg | Freq: Four times a day (QID) | RECTAL | Status: DC | PRN

## 2021-09-16 MED ORDER — OYSTER SHELL CALCIUM/D3 500-5 MG-MCG PO TABS
1.0000 | ORAL_TABLET | Freq: Three times a day (TID) | ORAL | Status: DC
  Administered 2021-09-17 – 2021-09-19 (×8): 1 via ORAL
  Filled 2021-09-16 (×8): qty 1

## 2021-09-16 MED ORDER — MORPHINE SULFATE (PF) 2 MG/ML IV SOLN: 1.0000 mg | INTRAVENOUS | Status: DC | PRN

## 2021-09-16 MED ORDER — ONDANSETRON HCL 4 MG PO TABS: 4.0000 mg | ORAL_TABLET | Freq: Four times a day (QID) | ORAL | Status: DC | PRN

## 2021-09-16 MED ORDER — ONDANSETRON HCL 4 MG/2ML IJ SOLN: 4.0000 mg | Freq: Four times a day (QID) | INTRAMUSCULAR | Status: DC | PRN

## 2021-09-16 MED ORDER — FENTANYL CITRATE PF 50 MCG/ML IJ SOSY
25.0000 ug | PREFILLED_SYRINGE | INTRAMUSCULAR | Status: DC | PRN
  Administered 2021-09-16: 25 ug via INTRAVENOUS
  Filled 2021-09-16: qty 1

## 2021-09-16 MED ORDER — SODIUM CHLORIDE 0.9 % IV SOLN
INTRAVENOUS | Status: DC
Start: 2021-09-16 — End: 2021-09-17

## 2021-09-16 MED ORDER — LEVOTHYROXINE SODIUM 50 MCG PO TABS
50.0000 ug | ORAL_TABLET | ORAL | Status: DC
  Administered 2021-09-17: 50 ug via ORAL
  Filled 2021-09-16: qty 1

## 2021-09-16 MED ORDER — ACETAMINOPHEN 500 MG PO TABS
1000.0000 mg | ORAL_TABLET | Freq: Once | ORAL | Status: AC
  Administered 2021-09-16: 1000 mg via ORAL
  Filled 2021-09-16: qty 2

## 2021-09-16 MED ORDER — OXYCODONE-ACETAMINOPHEN 5-325 MG PO TABS
1.0000 | ORAL_TABLET | ORAL | Status: DC | PRN
  Administered 2021-09-17 (×2): 1 via ORAL
  Filled 2021-09-16 (×2): qty 1

## 2021-09-16 MED ORDER — OXYCODONE HCL 5 MG PO TABS
2.5000 mg | ORAL_TABLET | Freq: Once | ORAL | Status: AC
  Administered 2021-09-16: 2.5 mg via ORAL
  Filled 2021-09-16: qty 1

## 2021-09-16 MED ORDER — SODIUM CHLORIDE 0.9 % IV SOLN: Freq: Once | INTRAVENOUS | Status: AC

## 2021-09-16 MED ORDER — HYDROCHLOROTHIAZIDE 25 MG PO TABS
25.0000 mg | ORAL_TABLET | Freq: Every day | ORAL | Status: DC
  Administered 2021-09-17 – 2021-09-19 (×3): 25 mg via ORAL
  Filled 2021-09-16 (×3): qty 1

## 2021-09-16 MED ORDER — ATORVASTATIN CALCIUM 20 MG PO TABS
40.0000 mg | ORAL_TABLET | ORAL | Status: DC
  Administered 2021-09-17: 40 mg via ORAL
  Filled 2021-09-16 (×2): qty 2

## 2021-09-16 MED ORDER — MAGNESIUM HYDROXIDE 400 MG/5ML PO SUSP
30.0000 mL | Freq: Every day | ORAL | Status: DC | PRN
  Administered 2021-09-17: 30 mL via ORAL
  Filled 2021-09-16: qty 30

## 2021-09-16 MED ORDER — ONDANSETRON HCL 4 MG/2ML IJ SOLN
4.0000 mg | Freq: Once | INTRAMUSCULAR | Status: AC
  Administered 2021-09-16: 4 mg via INTRAVENOUS
  Filled 2021-09-16: qty 2

## 2021-09-16 MED ORDER — TRAZODONE HCL 50 MG PO TABS
25.0000 mg | ORAL_TABLET | Freq: Every evening | ORAL | Status: DC | PRN
  Administered 2021-09-17: 25 mg via ORAL
  Filled 2021-09-16: qty 1

## 2021-09-16 MED ORDER — LISINOPRIL 10 MG PO TABS
5.0000 mg | ORAL_TABLET | Freq: Every day | ORAL | Status: DC
  Administered 2021-09-17 – 2021-09-19 (×3): 5 mg via ORAL
  Filled 2021-09-16 (×3): qty 1

## 2021-09-16 MED ORDER — PANTOPRAZOLE SODIUM 40 MG PO TBEC
40.0000 mg | DELAYED_RELEASE_TABLET | Freq: Every day | ORAL | Status: DC
  Administered 2021-09-17 – 2021-09-19 (×3): 40 mg via ORAL
  Filled 2021-09-16 (×3): qty 1

## 2021-09-16 MED ORDER — POTASSIUM CHLORIDE 20 MEQ PO PACK
40.0000 meq | PACK | Freq: Once | ORAL | Status: AC
  Administered 2021-09-16: 40 meq via ORAL
  Filled 2021-09-16: qty 2

## 2021-09-16 MED ORDER — OXYCODONE HCL 5 MG PO TABS: 5.0000 mg | ORAL_TABLET | Freq: Once | ORAL | Status: DC

## 2021-09-16 MED ORDER — ACETAMINOPHEN 325 MG PO TABS
650.0000 mg | ORAL_TABLET | Freq: Four times a day (QID) | ORAL | Status: DC | PRN
  Administered 2021-09-17 – 2021-09-18 (×2): 650 mg via ORAL
  Filled 2021-09-16 (×2): qty 2

## 2021-09-16 MED ORDER — MECLIZINE HCL 25 MG PO TABS: 12.5000 mg | ORAL_TABLET | Freq: Three times a day (TID) | ORAL | Status: DC | PRN

## 2021-09-16 MED ORDER — LEVOTHYROXINE SODIUM 50 MCG PO TABS
75.0000 ug | ORAL_TABLET | ORAL | Status: DC
  Administered 2021-09-18 – 2021-09-19 (×2): 75 ug via ORAL
  Filled 2021-09-16 (×2): qty 2

## 2021-09-16 NOTE — H&P (Incomplete)
[place]   PATIENT NAME: Christine Owen    MR#:  [id number]  DATE OF BIRTH:  10/29/1926  DATE OF ADMISSION:  09/16/2021  PRIMARY CARE PHYSICIAN: Christine Body, MD   Patient is coming from: Home  REQUESTING/REFERRING PHYSICIAN: Duffy Bruce, MD  CHIEF COMPLAINT:   Chief Complaint  Patient presents with   Fall    HISTORY OF PRESENT ILLNESS:  Christine Owen is a 88 y.o. 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  The patient was coming out of the shower and lost her balance in her bathroom with subsequent fall on the right side hitting her right shoulder and hip.  No presyncope or syncope.  No headache or dizziness or blurred vision.  No head injuries.  No chest pain or palpitations.  No paresthesias or focal muscle weakness.  No cough or wheezing or dyspnea.  No nausea or vomiting or abdominal pain.  No dysuria, oliguria or hematuria or flank pain.  ED Course: When she came to the ER BP was 147/56 with otherwise normal vital signs.  Labs revealed hypokalemia 3.2 and CO2 21 with a glucose of 161, BUN of 30 creatinine 1.46 compared to 3/1.42 on 05/11/2020.  CBC showed leukocytosis 16.2 with neutrophilia.  Imaging: Portable chest ray showed no acute cardiopulmonary disease.  Right shoulder x-ray showed scapular fracture with mild displacement extending through the glenoid.  Noncontrast head CT scan revealed no acute intracranial normalities and C-spine CT showed cervical spondylosis with encroachment of neural foramina from C3-7 levels.  Pelvic CT without contrast showed acute minimally displaced fracture of the anterior acetabulum near the junction with the superior pubic ramus and possible additional nondisplaced fracture of the inferior pubic ramus.  Right shoulder CTs currently  pending.  The patient was given 25 mcg of IV fentanyl, 4 mg of IV morphine sulfate and 4 mg of IV Zofran, oxycodone IR 2.5 mg and IV hydration with normal saline at 75 mill per hour as well as 1 g of p.o. Tylenol.  She will be admitted to a medical-surgical bed for further evaluation and management PAST MEDICAL HISTORY:   Past Medical History:  Diagnosis Date   Hypertension    Paget's disease of left breast ([place]) 2015   surgery, radiation tx finished 11/2013   Personal history of radiation therapy    Thyroid disease     PAST SURGICAL HISTORY:   Past Surgical History:  Procedure Laterality Date   BREAST BIOPSY Left 2015   pagets   BREAST LUMPECTOMY Left 2015   CATARACT EXTRACTION      SOCIAL HISTORY:   Social History   Tobacco Use   Smoking status: Former   Smokeless tobacco: Never  Substance Use Topics   Alcohol use: Yes    Comment: rare    FAMILY HISTORY:   Family History  Problem Relation Age of Onset   Breast cancer Mother 24    DRUG ALLERGIES:   Allergies  Allergen Reactions   Alendronate Sodium Other (See Comments)    Worsened acid reflux   Ibandronic Acid Other (See Comments)    Worsened acid reflux   Chlorhexidine Rash    REVIEW OF SYSTEMS:   ROS As per history of present illness. All pertinent systems were reviewed above. Constitutional, HEENT, cardiovascular, respiratory, GI, GU, musculoskeletal, neuro, psychiatric, endocrine,  integumentary and hematologic systems were reviewed and are otherwise negative/unremarkable except for positive findings mentioned above in the HPI.   MEDICATIONS AT HOME:   Prior to Admission medications   Medication Sig Start Date End Date Taking? Authorizing Provider  atorvastatin (LIPITOR) 40 MG tablet Take 40 mg by mouth every other day. 01/21/17  Yes [provider]  Calcium Carb-Cholecalciferol 600-200 MG-UNIT TABS Take 1 tablet by mouth 3 (three) times daily.   Yes [provider]   hydrochlorothiazide (HYDRODIURIL) 25 MG tablet Take 25 mg by mouth daily.   Yes [provider]  levothyroxine (SYNTHROID) 50 MCG tablet Take 50 mcg by mouth daily. 08/19/21  Yes [provider]  levothyroxine (SYNTHROID) 75 MCG tablet Take 75 mcg by mouth daily before breakfast. 01/20/21 01/20/22 Yes [provider]  lisinopril (ZESTRIL) 5 MG tablet Take 5 mg by mouth daily. 08/18/21  Yes [provider]  omeprazole (PRILOSEC) 20 MG capsule Take 20 mg by mouth daily.   Yes [provider]  denosumab (PROLIA) 60 MG/ML SOLN injection Inject 60 mg into the skin every 6 (six) months. Administer in upper arm, thigh, or abdomen once a year.    [provider]  meclizine (ANTIVERT) 25 MG tablet Take 25 mg by mouth 3 (three) times daily as needed for dizziness. Reported on 06/22/2015    [provider]      VITAL SIGNS:  Blood pressure (!) 137/100, pulse 68, temperature 98.6 F (37 C), temperature source Oral, resp. rate 15, height '5\' 2"'$  (1.575 m), weight 55.3 kg, SpO2 94 %.  PHYSICAL EXAMINATION:  Physical Exam  GENERAL:  88 y.o.-year-old Caucasian female patient lying in the bed with no acute distress.  EYES: Pupils equal, round, reactive to light and accommodation. No scleral icterus. Extraocular muscles intact.  HEENT: Head atraumatic, normocephalic. Oropharynx and nasopharynx clear.  NECK:  Supple, no jugular venous distention. No thyroid enlargement, no tenderness.  LUNGS: Normal breath sounds bilaterally, no wheezing, rales,rhonchi or crepitation. No use of accessory muscles of respiration.  CARDIOVASCULAR: Regular rate and rhythm, S1, S2 normal. No murmurs, rubs, or gallops.  ABDOMEN: Soft, nondistended, nontender. Bowel sounds present. No organomegaly or mass.  EXTREMITIES: No pedal edema, cyanosis, or clubbing. Musculoskeletal: Right hip tenderness with decreased knee motion due to pain.    Right upper back with decreased  range of motion of the shoulder. NEUROLOGIC: Cranial nerves II through XII are intact. Muscle strength 5/5 in all extremities. Sensation intact. Gait not checked.  PSYCHIATRIC: The patient is alert and oriented x 3.  Normal affect and good eye contact. SKIN: No obvious rash, lesion, or ulcer.   LABORATORY PANEL:   CBC Recent Labs  Lab 09/16/21 1514  WBC 16.2*  HGB 12.1  HCT 36.3  PLT 125*   ------------------------------------------------------------------------------------------------------------------  Chemistries  Recent Labs  Lab 09/16/21 1514  NA 138  K 3.2*  CL 108  CO2 21*  GLUCOSE 161*  BUN 30*  CREATININE 1.46*  CALCIUM 9.7  AST 26  ALT 22  ALKPHOS 45  BILITOT 1.1   ------------------------------------------------------------------------------------------------------------------  Cardiac Enzymes No results for input(s): "TROPONINI" in the last 168 hours. ------------------------------------------------------------------------------------------------------------------  RADIOLOGY:  CT Cervical Spine Wo Contrast  Result Date: 09/16/2021 CLINICAL DATA:  Trauma, fall EXAM: CT CERVICAL SPINE WITHOUT CONTRAST TECHNIQUE: Multidetector CT imaging of the cervical spine was performed without intravenous contrast. Multiplanar CT image reconstructions were also generated. RADIATION DOSE REDUCTION: This exam was performed according to the departmental dose-optimization program which  includes automated exposure control, adjustment of the mA and/or kV according to patient size and/or use of iterative reconstruction technique. COMPARISON:  02/14/2011 FINDINGS: Alignment: Alignment of posterior margins of vertebral bodies is unremarkable. Skull base and vertebrae: No recent fracture is seen. There is mild deformity at the base of the odontoid process without break in the cortical margins suggesting old healed fracture. Degenerative changes are noted with bony spurs and facet  hypertrophy at multiple levels. Soft tissues and spinal canal: There is no central spinal stenosis. Disc levels: There is encroachment of neural foramina from C3-C7 levels by bony spurs and facet hypertrophy. Upper chest: Linear density in the posterior right upper lung field may suggest scarring. Other: Extensive arterial calcifications are seen. IMPRESSION: No recent fracture is seen in cervical spine. Cervical spondylosis with encroachment of neural foramina from C3-C7 levels. Electronically Signed   By: Elmer Picker M.D.   On: 09/16/2021 21:59   CT HEAD WO CONTRAST (5MM)  Result Date: 09/16/2021 CLINICAL DATA:  Trauma, fall EXAM: CT HEAD WITHOUT CONTRAST TECHNIQUE: Contiguous axial images were obtained from the base of the skull through the vertex without intravenous contrast. RADIATION DOSE REDUCTION: This exam was performed according to the departmental dose-optimization program which includes automated exposure control, adjustment of the mA and/or kV according to patient size and/or use of iterative reconstruction technique. COMPARISON:  MR brain done on 05/11/2020 FINDINGS: Brain: No acute intracranial findings are seen. There are no signs of bleeding within the cranium. Calcifications are noted in basal ganglia on both sides. Cortical sulci are prominent. There is decreased density in periventricular and subcortical white matter. Vascular: Scattered arterial calcifications are seen. Skull: Hyperostosis frontalis interna is seen. Sinuses/Orbits: Unremarkable. There is suggestion of previous cataract surgery in the optic globes. Other: None. IMPRESSION: No acute intracranial findings are seen in noncontrast CT brain. Atrophy. Small-vessel disease. Electronically Signed   By: Elmer Picker M.D.   On: 09/16/2021 21:53   DG Chest Portable 1 View  Result Date: 09/16/2021 CLINICAL DATA:  Status post fall. EXAM: PORTABLE CHEST 1 VIEW COMPARISON:  May 11, 2020 FINDINGS: The heart size and  mediastinal contours are within normal limits. There is marked severity calcification of the aortic arch. Both lungs are clear. The right shoulder is not included in the field of view and is subsequently limited in evaluation. The visualized skeletal structures are unremarkable. IMPRESSION: No active disease. Electronically Signed   By: Virgina Norfolk M.D.   On: 09/16/2021 21:46   CT PELVIS WO CONTRAST  Result Date: 09/16/2021 CLINICAL DATA:  Right hip pain after fall EXAM: CT PELVIS WITHOUT CONTRAST TECHNIQUE: Multidetector CT imaging of the pelvis was performed following the standard protocol without intravenous contrast. RADIATION DOSE REDUCTION: This exam was performed according to the departmental dose-optimization program which includes automated exposure control, adjustment of the mA and/or kV according to patient size and/or use of iterative reconstruction technique. COMPARISON:  Radiographs earlier today FINDINGS: Urinary Tract:  No abnormality visualized. Bowel:  Unremarkable visualized pelvic bowel loops. Vascular/Lymphatic: No pathologically enlarged lymph nodes. Aortoiliac atherosclerotic calcification Reproductive:  No mass or other significant abnormality Other:  None. Musculoskeletal: Acute minimally displaced fracture of the anterior wall of the right acetabulum. The fracture line extends posteriorly with mild displacement of the medial acetabular wall (series 3/image 85). Possible nondisplaced fracture of the inferior pubic ramus (series 3/image 103). Compression fracture of the L4 vertebral Owen has a chronic appearance. IMPRESSION: Acute minimally displaced fracture of the anterior acetabulum near  the junction with the superior pubic ramus. Possible additional nondisplaced fracture of the inferior pubic ramus Electronically Signed   By: Placido Sou M.D.   On: 09/16/2021 18:35   DG Shoulder Right  Result Date: 09/16/2021 CLINICAL DATA:  Pain after fall EXAM: RIGHT SHOULDER - 2+ VIEW  COMPARISON:  None Available. FINDINGS: There is a mildly displaced fracture extending through the glenoid into the Owen of the scapula. The humerus and clavicle are intact. No other abnormalities. IMPRESSION: There is a scapular fracture with mild displacement extending through the glenoid. Recommend CT imaging for better evaluation. Electronically Signed   By: Dorise Bullion III M.D.   On: 09/16/2021 16:06   DG Hip Unilat  With Pelvis 2-3 Views Right  Result Date: 09/16/2021 CLINICAL DATA:  Pain after fall EXAM: DG HIP (WITH OR WITHOUT PELVIS) 2-3V RIGHT COMPARISON:  None Available. FINDINGS: There is a step-off at the junction of the right superior pubic ramus and the acetabulum. The remainder of the pelvic bones are intact. The proximal femurs are intact. IMPRESSION: There is a step-off at the junction of the superior right pubic ramus and the acetabulum. These findings may represent a subtle fracture. CT imaging could better evaluate. No other abnormalities. Electronically Signed   By: Dorise Bullion III M.D.   On: 09/16/2021 16:04      IMPRESSION AND PLAN:  Assessment and Plan: * Fall - This is an accidental mechanical fall. - The patient will be admitted to a medical-surgical bed. - Pain management to be provided. - PT consult will be obtained. - She has subsequent right acetabular and scapular fracture. - Orthopedic consult will be obtained.  Right acetabular fracture (HCC) - Pain management will be provided. - This is a nonsurgical fracture. - Orthopedic consult elevation will be obtained. - I notified Dr. Mack Guise about the patient  Closed right scapular fracture - This is nonsurgical. - Pain management will be provided.  GERD without esophagitis - We will continue PPI Therapy.  Essential hypertension - We will can continue her antihypertensives.  Acquired hypothyroidism - We will continue Synthroid.   DVT prophylaxis: Lovenox.  Advanced Care Planning:  Code Status:   DNR/DNI.  This was discussed with her. Family Communication:  The plan of care was discussed in details with the patient (and family). I answered all questions. The patient agreed to proceed with the above mentioned plan. Further management will depend upon hospital course. Disposition Plan: Back to previous home environment Consults called: Orthopedic consult. All the records are reviewed and case discussed with ED provider.  Status is: Inpatient   At the time of the admission, it appears that the appropriate admission status for this patient is inpatient.  This is judged to be reasonable and necessary in order to provide the required intensity of service to ensure the patient's safety given the presenting symptoms, physical exam findings and initial radiographic and laboratory data in the context of comorbid conditions.  The patient requires inpatient status due to high intensity of service, high risk of further deterioration and high frequency of surveillance required.  I certify that at the time of admission, it is my clinical judgment that the patient will require inpatient hospital care extending more than 2 midnights.                            Dispo: The patient is from: Home  Anticipated d/c is to: Home              Patient currently is not medically stable to d/c.              Difficult to place patient: No  Christel Mormon M.D on 09/16/2021 at 10:52 PM  [facility]   From 7 PM-7 AM, contact night-coverage [email or web address]  CC: Primary care physician; Christine Body, MD

## 2021-09-16 NOTE — Assessment & Plan Note (Signed)
Accidental

## 2021-09-16 NOTE — ED Provider Triage Note (Signed)
Emergency Medicine Provider Triage Evaluation Note  Christine Owen, a 88 y.o. female  was evaluated in triage.  Pt complains of mechanical fall.  Presents to the ED via EMS from home, where she slipped in the bathroom after getting out of the shower.  She denies any preceding weakness, dizziness, or paralysis.  Patient complains of right hip pain as well as right shoulder pain.  Review of Systems  Positive: Right shoulder/hip pain  Negative: LOC  Physical Exam  BP (!) 147/56 (BP Location: Left Arm)   Pulse 72   Temp 97.7 F (36.5 C) (Oral)   Resp 16   Ht '5\' 2"'$  (1.575 m)   Wt 55.3 kg   SpO2 95%   BMI 22.31 kg/m  Gen:   Awake, no distress  NAD Resp:  Normal effort CTA MSK:   Moves extremities without difficulty  CVS:  RRR, normal distal pulses  Medical Decision Making  Medically screening exam initiated at 4:44 PM.  Appropriate orders placed.  Christine Owen was informed that the remainder of the evaluation will be completed by another provider, this initial triage assessment does not replace that evaluation, and the importance of remaining in the ED until their evaluation is complete.  Geriatric patient to the ED for evaluation of injury sustained following mechanical fall.  She presents with complaints of right shoulder and right hip pain.   Christine Needles, PA-C 09/16/21 1647

## 2021-09-16 NOTE — ED Notes (Signed)
2lpm O2 added to pt due to sating at 89-90% while resting

## 2021-09-16 NOTE — ED Notes (Signed)
Pt is A&Ox4. Pt is limited in movement due to a fall earlier today with corresponding Fx to r scapula and pelvis. Pt receiving 53m/hour NS and was instructed that she is NPO. Pt said she is not hungry anyway.

## 2021-09-16 NOTE — Progress Notes (Signed)
PHARMACIST - PHYSICIAN COMMUNICATION  CONCERNING:  Enoxaparin (Lovenox) for DVT Prophylaxis    RECOMMENDATION: Patient was prescribed enoxaprin '40mg'$  q24 hours for VTE prophylaxis.   Filed Weights   09/16/21 1459  Weight: 55.3 kg (122 lb)    Body mass index is 22.31 kg/m.  Estimated Creatinine Clearance: 19.4 mL/min (A) (by C-G formula based on SCr of 1.46 mg/dL (H)).  Patient is candidate for enoxaparin '30mg'$  every 24 hours based on CrCl <61m/min or Weight <45kg  DESCRIPTION: [facility]  Patient is now receiving enoxaparin 30 mg every 24 hours   NRenda Rolls PharmD, [facility]8/01/2022 10:47 PM

## 2021-09-16 NOTE — Assessment & Plan Note (Signed)
-   We will continue Synthroid. 

## 2021-09-16 NOTE — Assessment & Plan Note (Signed)
Nonsurgical - Weightbearing as tolerated - Percocet as needed - Follow-up with orthopedics as an outpatient - Physical therapy, TOC

## 2021-09-16 NOTE — ED Triage Notes (Signed)
Pt in via EMS from home with c/o fall. Pt fell in the bathroom, unwitnessed and called her family to come help. Pt c/o right hip and shoulder pain, #22g left hand and was given 13mg of fentanyl

## 2021-09-16 NOTE — Assessment & Plan Note (Signed)
Blood pressure normal - Continue HCTZ, lisinopril

## 2021-09-16 NOTE — ED Provider Notes (Signed)
[facility] Provider Note    Event Date/Time   First MD Initiated Contact with Patient 09/16/21 2040     (approximate)   History   Fall   HPI  Christine Owen is a 88 y.o. female here with fall.  The patient is normally very healthy, lives alone.  She was getting out of her bathroom today when she fell, striking her right shoulder and right hip on the ground.  She does not know if she hit her head but does not believe she did.  No headache or neck pain.  She states that she has subsequently had aching, throbbing, severe right shoulder as well as right hip pain.  Pain is worse with any movement.  No history of previous injury to the area.  She is right-hand dominant.  No alleviating factors.  No blood thinner use.     Physical Exam   Triage Vital Signs: ED Triage Vitals  Enc Vitals Group     BP 09/16/21 1458 (!) 147/56     Pulse Rate 09/16/21 1458 72     Resp 09/16/21 1458 16     Temp 09/16/21 1458 97.7 F (36.5 C)     Temp Source 09/16/21 1458 Oral     SpO2 09/16/21 1458 95 %     Weight 09/16/21 1459 122 lb (55.3 kg)     Height 09/16/21 1459 '5\' 2"'$  (1.575 m)     Head Circumference --      Peak Flow --      Pain Score 09/16/21 1459 6     Pain Loc --      Pain Edu? --      Excl. in [place]? --     Most recent vital signs: Vitals:   09/16/21 2230 09/17/21 0030  BP: (!) 141/54 (!) 125/55  Pulse: 66 63  Resp: 18 18  Temp:    SpO2: 91% 99%     General: Awake, no distress.  CV:  Good peripheral perfusion.  Resp:  Normal effort.  Lungs clear to auscultation bilaterally. Abd:  No distention.  Other:  Significant tenderness over the right scapula, tenderness with any passive range of motion of the upper arm.  No deformity.  No open wounds.  Tenderness to palpation over the right hip and anterior pelvis, no instability appreciated.  No shortening of the extremities.  Right lower extremity does not appear shortened or externally rotated.  No tenderness  over the greater trochanter itself.   ED Results / Procedures / Treatments   Labs (all labs ordered are listed, but only abnormal results are displayed) Labs Reviewed  CBC WITH DIFFERENTIAL/PLATELET - Abnormal; Notable for the following components:      Result Value   WBC 16.2 (*)    Platelets 125 (*)    Neutro Abs 13.8 (*)    Abs Immature Granulocytes 0.17 (*)    All other components within normal limits  COMPREHENSIVE METABOLIC PANEL - Abnormal; Notable for the following components:   Potassium 3.2 (*)    CO2 21 (*)    Glucose, Bld 161 (*)    BUN 30 (*)    Creatinine, Ser 1.46 (*)    GFR, Estimated 34 (*)    All other components within normal limits  URINALYSIS, ROUTINE W REFLEX MICROSCOPIC  BASIC METABOLIC PANEL  CBC     EKG    RADIOLOGY DG shoulder right: Scapular fracture with mild displacement extending to the glenoid DG hip right: Minimally displaced  fracture of the anterior acetabulum, Chest x-ray: No active disease CT cervical spine: No recent fracture CT head: Negative CT shoulder right: Pending   I also independently reviewed and agree with radiologist interpretations.   PROCEDURES:  Critical Care performed: No   MEDICATIONS ORDERED IN ED: Medications  fentaNYL (SUBLIMAZE) injection 25 mcg (25 mcg Intravenous Given 09/16/21 1650)  atorvastatin (LIPITOR) tablet 40 mg (has no administration in time range)  hydrochlorothiazide (HYDRODIURIL) tablet 25 mg (has no administration in time range)  lisinopril (ZESTRIL) tablet 5 mg (has no administration in time range)  levothyroxine (SYNTHROID) tablet 75 mcg (has no administration in time range)  levothyroxine (SYNTHROID) tablet 50 mcg (has no administration in time range)  meclizine (ANTIVERT) tablet 12.5 mg (has no administration in time range)  pantoprazole (PROTONIX) EC tablet 40 mg (has no administration in time range)  calcium-vitamin D (OSCAL WITH D) 500-5 MG-MCG per tablet 1 tablet (has no  administration in time range)  enoxaparin (LOVENOX) injection 30 mg (has no administration in time range)  0.9 %  sodium chloride infusion ( Intravenous New Bag/Given 09/16/21 2303)  acetaminophen (TYLENOL) tablet 650 mg (has no administration in time range)    Or  acetaminophen (TYLENOL) suppository 650 mg (has no administration in time range)  traZODone (DESYREL) tablet 25 mg (has no administration in time range)  magnesium hydroxide (MILK OF MAGNESIA) suspension 30 mL (has no administration in time range)  ondansetron (ZOFRAN) tablet 4 mg (has no administration in time range)    Or  ondansetron (ZOFRAN) injection 4 mg (has no administration in time range)  morphine (PF) 2 MG/ML injection 1-2 mg (has no administration in time range)  oxyCODONE-acetaminophen (PERCOCET/ROXICET) 5-325 MG per tablet 1-2 tablet (has no administration in time range)  ondansetron (ZOFRAN) injection 4 mg (4 mg Intravenous Given 09/16/21 1650)  0.9 %  sodium chloride infusion (0 mLs Intravenous Stopped 09/16/21 2309)  acetaminophen (TYLENOL) tablet 1,000 mg (1,000 mg Oral Given 09/16/21 2200)  oxyCODONE (Oxy IR/ROXICODONE) immediate release tablet 2.5 mg (2.5 mg Oral Given 09/16/21 2249)  potassium chloride (KLOR-CON) packet 40 mEq (40 mEq Oral Given 09/16/21 2302)     IMPRESSION / MDM / ASSESSMENT AND PLAN / ED COURSE  I reviewed the triage vital signs and the nursing notes.                              Ddx:  Differential includes the following, with pertinent life- or limb-threatening emergencies considered:  Fall, mechanical, with shoulder/hip fx or sprain; fall 2/2 general medical condition such as UTI, PNA, anemia, dehydration, CVA  Patient's presentation is most consistent with acute presentation with potential threat to life or bodily function.  MDM:  88 yo F here with mechanical fall and R shoulder, R hip pain. Imaging reviewed as above. R shoulder pain is 2/2 scapular fx involving [place] joint - will place  in sling for now, she is NVI. No rib pain or signs of thoracic injury. R hip pain is 2/2 right pelvis/acetabular fx. Femur normal per radiology. Will admit for pain control, PT, further management.   MEDICATIONS GIVEN IN ED: Medications  fentaNYL (SUBLIMAZE) injection 25 mcg (25 mcg Intravenous Given 09/16/21 1650)  atorvastatin (LIPITOR) tablet 40 mg (has no administration in time range)  hydrochlorothiazide (HYDRODIURIL) tablet 25 mg (has no administration in time range)  lisinopril (ZESTRIL) tablet 5 mg (has no administration in time range)  levothyroxine (SYNTHROID) tablet 75  mcg (has no administration in time range)  levothyroxine (SYNTHROID) tablet 50 mcg (has no administration in time range)  meclizine (ANTIVERT) tablet 12.5 mg (has no administration in time range)  pantoprazole (PROTONIX) EC tablet 40 mg (has no administration in time range)  calcium-vitamin D (OSCAL WITH D) 500-5 MG-MCG per tablet 1 tablet (has no administration in time range)  enoxaparin (LOVENOX) injection 30 mg (has no administration in time range)  0.9 %  sodium chloride infusion ( Intravenous New Bag/Given 09/16/21 2303)  acetaminophen (TYLENOL) tablet 650 mg (has no administration in time range)    Or  acetaminophen (TYLENOL) suppository 650 mg (has no administration in time range)  traZODone (DESYREL) tablet 25 mg (has no administration in time range)  magnesium hydroxide (MILK OF MAGNESIA) suspension 30 mL (has no administration in time range)  ondansetron (ZOFRAN) tablet 4 mg (has no administration in time range)    Or  ondansetron (ZOFRAN) injection 4 mg (has no administration in time range)  morphine (PF) 2 MG/ML injection 1-2 mg (has no administration in time range)  oxyCODONE-acetaminophen (PERCOCET/ROXICET) 5-325 MG per tablet 1-2 tablet (has no administration in time range)  ondansetron (ZOFRAN) injection 4 mg (4 mg Intravenous Given 09/16/21 1650)  0.9 %  sodium chloride infusion (0 mLs Intravenous  Stopped 09/16/21 2309)  acetaminophen (TYLENOL) tablet 1,000 mg (1,000 mg Oral Given 09/16/21 2200)  oxyCODONE (Oxy IR/ROXICODONE) immediate release tablet 2.5 mg (2.5 mg Oral Given 09/16/21 2249)  potassium chloride (KLOR-CON) packet 40 mEq (40 mEq Oral Given 09/16/21 2302)     Consults:     EMR reviewed       FINAL CLINICAL IMPRESSION(S) / ED DIAGNOSES   Final diagnoses:  Fall, initial encounter  Closed displaced fracture of right acetabulum, unspecified portion of acetabulum, initial encounter ([place])  Closed displaced fracture of body of right scapula, initial encounter     Rx / DC Orders   ED Discharge Orders     None        Note:  This document was prepared using Dragon voice recognition software and may include unintentional dictation errors.   Duffy Bruce, MD 09/17/21 [phone]

## 2021-09-16 NOTE — Assessment & Plan Note (Signed)
-   We will continue PPI Therapy.

## 2021-09-16 NOTE — Assessment & Plan Note (Signed)
-   This is nonsurgical. - Pain management will be provided.

## 2021-09-17 DIAGNOSIS — S42101A Fracture of unspecified part of scapula, right shoulder, initial encounter for closed fracture: Secondary | ICD-10-CM | POA: Diagnosis not present

## 2021-09-17 DIAGNOSIS — E876 Hypokalemia: Secondary | ICD-10-CM

## 2021-09-17 DIAGNOSIS — S32401A Unspecified fracture of right acetabulum, initial encounter for closed fracture: Secondary | ICD-10-CM | POA: Diagnosis not present

## 2021-09-17 DIAGNOSIS — E785 Hyperlipidemia, unspecified: Secondary | ICD-10-CM

## 2021-09-17 DIAGNOSIS — W19XXXA Unspecified fall, initial encounter: Secondary | ICD-10-CM | POA: Diagnosis not present

## 2021-09-17 DIAGNOSIS — E039 Hypothyroidism, unspecified: Secondary | ICD-10-CM | POA: Diagnosis not present

## 2021-09-17 DIAGNOSIS — N1832 Chronic kidney disease, stage 3b: Secondary | ICD-10-CM

## 2021-09-17 LAB — CBC
HCT: 31.2 % — ABNORMAL LOW (ref 36.0–46.0)
Hemoglobin: 10.8 g/dL — ABNORMAL LOW (ref 12.0–15.0)
MCH: 31.5 pg (ref 26.0–34.0)
MCHC: 34.6 g/dL (ref 30.0–36.0)
MCV: 91 fL (ref 80.0–100.0)
Platelets: 110 10*3/uL — ABNORMAL LOW (ref 150–400)
RBC: 3.43 MIL/uL — ABNORMAL LOW (ref 3.87–5.11)
RDW: 13.2 % (ref 11.5–15.5)
WBC: 7.9 10*3/uL (ref 4.0–10.5)
nRBC: 0 % (ref 0.0–0.2)

## 2021-09-17 LAB — BASIC METABOLIC PANEL
Anion gap: 7 (ref 5–15)
BUN: 29 mg/dL — ABNORMAL HIGH (ref 8–23)
CO2: 21 mmol/L — ABNORMAL LOW (ref 22–32)
Calcium: 8.7 mg/dL — ABNORMAL LOW (ref 8.9–10.3)
Chloride: 110 mmol/L (ref 98–111)
Creatinine, Ser: 1.47 mg/dL — ABNORMAL HIGH (ref 0.44–1.00)
GFR, Estimated: 33 mL/min — ABNORMAL LOW (ref >60)
Glucose, Bld: 112 mg/dL — ABNORMAL HIGH (ref 70–99)
Potassium: 3.9 mmol/L (ref 3.5–5.1)
Sodium: 138 mmol/L (ref 135–145)

## 2021-09-17 NOTE — Assessment & Plan Note (Signed)
Creatinine stable relative to baseline 

## 2021-09-17 NOTE — Progress Notes (Signed)
  Progress Note   Patient: Christine Owen TAV:[id number] DOB: Aug 27, 1926 DOA: 09/16/2021     1 DOS: the patient was seen and examined on 09/17/2021       Brief hospital course: Mrs. Spiegelman is a 88 y.o. F with CKD IIIb, HTN, diet controlled DM and hypothyroidism who presented with fall.  In the ER< found to have acetabular fracture and scapular fracture.  Admitted for PT and placement.     Assessment and Plan: * Right acetabular fracture ([place]) Nonsurgical - Weightbearing as tolerated - Percocet as needed -Follow-up with orthopedics as an outpatient - Physical therapy, TOC  Closed right scapular fracture - Nonweightbearing right upper extremity - Maintain arm in sling  Hyperlipidemia - Continue atorvastatin  Stage 3b chronic kidney disease (CKD) ([facility]) Creatinine stable relative to baseline  Hypokalemia Supplemented and resolved  Essential hypertension Blood pressure normal - Continue HCTZ, lisinopril  Fall Accidental.  Acquired hypothyroidism - Continue levothyroxine          Subjective: Patient is feeling well, she has severe pain with standing, cannot walk.  No chest discomfort, respiratory discomfort, confusion.     Physical Exam: Vitals:   09/17/21 0030 09/17/21 0330 09/17/21 0400 09/17/21 0800  BP: (!) 125/55 (!) 114/53 (!) 121/56 (!) 126/97  Pulse: 63 61 60 62  Resp: '18 13 15 14  '$ Temp:    97.7 F (36.5 C)  TempSrc:    Oral  SpO2: 99% 97% 96% 96%  Weight:      Height:       Elderly adult female, thin, lying in bed, no acute distress. RRR, no murmurs, no peripheral edema Respiratory rate normal, lungs clear without rales or wheezing Attention normal, affect appropriate, judgment insight appear normal, face symmetric, speech fluent    Data Reviewed: Basic metabolic panel shows resolved hypokalemia Hemogram shows resolved leukocytosis, mild anemia, platelets one 10K CT of the head and C-spine is normal CT of the pelvis shows a right  acetabular fracture   Family Communication: Daughter and son at the bedside    Disposition: Status is: Inpatient The patient was admitted with acetabular fracture and right upper extremity scapular fracture.  This is nonoperative, but she is nonweightbearing to the right upper extremity, and has severe pain with standing, inability to ambulate.  We will evaluate with PT, but I suspect she will need rehabilitation prior to her returning home.        Author: Edwin Dada, MD 09/17/2021 1:38 PM  For on call review [email or web address].

## 2021-09-17 NOTE — Hospital Course (Signed)
Christine Owen is a 88 y.o. F with CKD IIIb, HTN, diet controlled DM and hypothyroidism who presented with fall.  In the ER< found to have acetabular fracture and scapular fracture.  Admitted for PT and placement.  Orthopedic surgery was consulted and they recommend conservative management by using a sling in the upper extremity and she will remain partial weightbearing on right lower extremity as tolerated.  She can use a walker or a wheelchair based on her balance as she is currently unable to bear much weight due to pain.  She will be nonweightbearing on the right upper extremity. Need to have a follow-up with [facility] in 2 to 3 weeks for reevaluation.  PT and OT recommended SNF and patient is being discharged to rehab for further management.  She will continue on current medications and will follow-up with orthopedic surgery and her primary care provider for further care.

## 2021-09-17 NOTE — Assessment & Plan Note (Signed)
-   Supplemented and resolved 

## 2021-09-17 NOTE — Evaluation (Signed)
Physical Therapy Evaluation Patient Details Name: Christine Owen MRN: [id number] DOB: 09-Dec-1926 Today's Date: 09/17/2021  History of Present Illness  Pt is a 88 y/o F admitted on 09/16/21 after presenting to the ED with c/o fall. Pt found to have R acetabular fx & closed R scapular fx. PMH: HTN, hypothyroidism, Paget's disease of the breast s/p breast lumpectomy & radiotherapy  Clinical Impression  Pt seen for PT evaluation with pt's son & daughter present during session. Prior to admission pt lived alone in a 2 level home with 3 steps with wide-set B rails to enter & full flgiht with R rail to access bed & bath on 2nd level. Pt was independent without AD, driving, and no other fall hx. On this date, PT educates pt on NWB RUE & WBAT BLE (per MD). Pt requires mod assist for supine>sit & max assist for sit>supine with HOB elevated & bed rails. Pt is able to transfer STS ~3 times with mod assist & tolerate standing ~2 minutes max. Attempted gait with HHA but pt unable to weight shift to take steps but was able to scoot feet along EOB to L (not R) to scoot to [facility]. At this time, family is unable to provide 24 hr assist & pt is unsafe to d/c home alone. Pt would benefit from STR upon d/c to maximize independence with functional mobility & reduce fall risk prior to return home.      Recommendations for follow up therapy are one component of a multi-disciplinary discharge planning process, led by the attending physician.  Recommendations may be updated based on patient status, additional functional criteria and insurance authorization.  Follow Up Recommendations Skilled nursing-short term rehab (<3 hours/day) Can patient physically be transported by private vehicle: No    Assistance Recommended at Discharge Frequent or constant Supervision/Assistance  Patient can return home with the following  A lot of help with walking and/or transfers;A lot of help with bathing/dressing/bathroom;Assist for  transportation;Help with stairs or ramp for entrance;Direct supervision/assist for medications management;Assistance with cooking/housework    Equipment Recommendations None recommended by PT  Recommendations for Other Services  OT consult    Functional Status Assessment Patient has had a recent decline in their functional status and demonstrates the ability to make significant improvements in function in a reasonable and predictable amount of time.     Precautions / Restrictions Precautions Precautions: Fall Restrictions Weight Bearing Restrictions: Yes RUE Weight Bearing: Non weight bearing RLE Weight Bearing: Weight bearing as tolerated LLE Weight Bearing: Weight bearing as tolerated      Mobility  Bed Mobility Overal bed mobility: Needs Assistance Bed Mobility: Supine to Sit, Sit to Supine     Supine to sit: Mod assist, HOB elevated Sit to supine: Mod assist, HOB elevated   General bed mobility comments: +2 to scoot to [facility]    Transfers Overall transfer level: Needs assistance Equipment used: 1 person hand held assist Transfers: Sit to/from Stand Sit to Stand: Min assist, Mod assist                Ambulation/Gait                  Stairs            Wheelchair Mobility    Modified Rankin (Stroke Patients Only)       Balance Overall balance assessment: Needs assistance Sitting-balance support: Feet supported, Bilateral upper extremity supported Sitting balance-Leahy Scale: Fair Sitting balance - Comments: supervision static sitting EOB  Standing balance support: During functional activity, Single extremity supported Standing balance-Leahy Scale: Poor                               Pertinent Vitals/Pain Pain Assessment Pain Assessment: Faces Faces Pain Scale: Hurts whole lot Pain Location: RUE, BLE, pelvis Pain Descriptors / Indicators: Discomfort, Grimacing Pain Intervention(s): Monitored during session, RN gave pain  meds during session    [place] expects to be discharged to:: Private residence Living Arrangements: Alone Available Help at Discharge: Family;Available PRN/intermittently Type of Home: House Home Access: Stairs to enter Entrance Stairs-Rails: Right;Left Entrance Stairs-Number of Steps: 3 Alternate Level Stairs-Number of Steps: flight Home Layout: Multi-level;Bed/bath upstairs Home Equipment: None      Prior Function Prior Level of Function : Independent/Modified Independent;Driving             Mobility Comments: Independent without AD, no other falls besides this one ADLs Comments: cooks simple meals, cleans     Hand Dominance   Dominant Hand: Right    Extremity/Trunk Assessment   Upper Extremity Assessment Upper Extremity Assessment:  (RUE in sling)    Lower Extremity Assessment Lower Extremity Assessment: Generalized weakness (2/2 pain (3/5 knee extension LLE in sitting))       Communication      Cognition Arousal/Alertness: Awake/alert Behavior During Therapy: WFL for tasks assessed/performed Overall Cognitive Status: Within Functional Limits for tasks assessed                                          General Comments General comments (skin integrity, edema, etc.): Pt received on supplemental O2 but placed on room air & SPO2 >90% throughout. BP in LUE in supine: 113/54 mmHg MAP 68, sitting EOB 125/99 mmHg MAP 107, attempted to obtain reading in standing but unable 2/2 dinamap malfunction    Exercises     Assessment/Plan    PT Assessment Patient needs continued PT services  PT Problem List Decreased strength;Decreased mobility;Decreased activity tolerance;Decreased balance;Decreased knowledge of use of DME;Pain;Cardiopulmonary status limiting activity;Decreased knowledge of precautions       PT Treatment Interventions DME instruction;Therapeutic exercise;Gait training;Balance training;Stair training;Neuromuscular  re-education;Functional mobility training;Patient/family education;Modalities;Manual techniques    PT Goals (Current goals can be found in the Care Plan section)  Acute Rehab PT Goals Patient Stated Goal: decreased pain, get better PT Goal Formulation: With patient/family Time For Goal Achievement: 10/01/21 Potential to Achieve Goals: Good    Frequency 7X/week     Co-evaluation               AM-PAC PT "6 Clicks" Mobility  Outcome Measure Help needed turning from your back to your side while in a flat bed without using bedrails?: A Lot Help needed moving from lying on your back to sitting on the side of a flat bed without using bedrails?: A Lot Help needed moving to and from a bed to a chair (including a wheelchair)?: A Lot Help needed standing up from a chair using your arms (e.g., wheelchair or bedside chair)?: A Lot Help needed to walk in hospital room?: Total Help needed climbing 3-5 steps with a railing? : Total 6 Click Score: 10    End of Session Equipment Utilized During Treatment:  (RUE sling) Activity Tolerance: Patient limited by pain Patient left: in bed;with family/visitor present;with call bell/phone within  reach Nurse Communication: Weight bearing status PT Visit Diagnosis: Unsteadiness on feet (R26.81);Muscle weakness (generalized) (M62.81);Other abnormalities of gait and mobility (R26.89);Difficulty in walking, not elsewhere classified (R26.2);Pain Pain - Right/Left: Right Pain - part of body: Shoulder (pelvis)    Time: 1340-1411 PT Time Calculation (min) (ACUTE ONLY): 31 min   Charges:   PT Evaluation $PT Eval Low Complexity: 1 Low PT Treatments $Therapeutic Activity: 8-22 mins        Lavone Nian, PT, DPT 09/17/21, 2:58 PM   Waunita Schooner 09/17/2021, 2:56 PM

## 2021-09-17 NOTE — Progress Notes (Signed)
Unable to fully do admission skin check.  Patient not able to roll on side due to pain and recent fall and fractures.  Also did not want to stand as she felt weak and can not bear weight on right side.  I will work with PT or OT tomorrow to further evaluate

## 2021-09-17 NOTE — Assessment & Plan Note (Signed)
Continue atorvastatin

## 2021-09-17 NOTE — Consult Note (Addendum)
ORTHOPAEDIC CONSULTATION  REQUESTING PHYSICIAN: Danford, Suann Larry, *  Chief Complaint: Right shoulder glenoid fracture and right superior and inferior pubic rami fractures status post fall  HPI: Christine Owen is a 88 y.o. female is seen in ER room 42 today with her son and daughter at the bedside.  Patient was admitted overnight after a fall getting out of the shower injuring her right hip and shoulder.  Patient did not evidence of a syncopal episode.  Today the patient is alert and conversive.  She is able to provide a history and follow commands.  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  She denies significant pain at rest.    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   surgery, radiation tx finished 11/2013   Personal history of radiation therapy    Thyroid disease    Past Surgical History:  Procedure Laterality Date   BREAST BIOPSY Left 2015   pagets   BREAST LUMPECTOMY Left 2015   CATARACT EXTRACTION     Social History   Socioeconomic History   Marital status: Widowed    Spouse name: Not on file   Number of children: Not on file   Years of education: Not on file   Highest education level: Not on file  Occupational History   Not on file  Tobacco Use   Smoking status: Former   Smokeless tobacco: Never  Substance and Sexual Activity   Alcohol use: Yes    Comment: rare   Drug use: Never   Sexual activity: Not on file  Other Topics Concern   Not on file  Social History Narrative   Not on file   Social Determinants of Health   Financial Resource Strain: Not on file  Food Insecurity: Not on file  Transportation Needs: Not on file  Physical Activity: Not on file  Stress: Not on file  Social Connections: Not on file   Family History  Problem Relation Age of Onset   Breast cancer Mother 41   Allergies  Allergen Reactions   Alendronate Sodium Other (See Comments)    Worsened  acid reflux   Ibandronic Acid Other (See Comments)    Worsened acid reflux   Chlorhexidine Rash   Prior to Admission medications   Medication Sig Start Date End Date Taking? Authorizing Provider  atorvastatin (LIPITOR) 40 MG tablet Take 40 mg by mouth every other day. 01/21/17  Yes [provider]  Calcium Carb-Cholecalciferol 600-200 MG-UNIT TABS Take 1 tablet by mouth 3 (three) times daily.   Yes [provider]  hydrochlorothiazide (HYDRODIURIL) 25 MG tablet Take 25 mg by mouth daily.   Yes [provider]  levothyroxine (SYNTHROID) 50 MCG tablet Take 50 mcg by mouth daily. 08/19/21  Yes [provider]  levothyroxine (SYNTHROID) 75 MCG tablet Take 75 mcg by mouth daily before breakfast. 01/20/21 01/20/22 Yes [provider]  lisinopril (ZESTRIL) 5 MG tablet Take 5 mg by mouth daily. 08/18/21  Yes [provider]  omeprazole (PRILOSEC) 20 MG capsule Take 20 mg by mouth daily.   Yes [provider]  denosumab (PROLIA) 60 MG/ML SOLN injection Inject 60 mg into the skin every 6 (six) months. Administer in upper arm, thigh, or abdomen once a year.    [provider]  meclizine (ANTIVERT) 25 MG tablet Take 25 mg by mouth 3 (three) times daily as needed for dizziness. Reported on 06/22/2015  [provider]   CT Shoulder Right Wo Contrast  Result Date: 09/17/2021 CLINICAL DATA:  Fall.  Right scapular fracture EXAM: CT OF THE UPPER RIGHT EXTREMITY WITHOUT CONTRAST TECHNIQUE: Multidetector CT imaging of the upper right extremity was performed according to the standard protocol. RADIATION DOSE REDUCTION: This exam was performed according to the departmental dose-optimization program which includes automated exposure control, adjustment of the mA and/or kV according to patient size and/or use of iterative reconstruction technique. COMPARISON:  X-ray 09/16/2021.  Thoracic MRI 02/18/2012 FINDINGS: Bones/Joint/Cartilage Acute  mildly comminuted fracture of the glenoid and supraspinous aspect of the right scapula (series 11, images 35-51). Fracture involves the glenoid articular surface where there is up to 3 mm of articular-surface depression. Y-shaped intra-articular fracture planes at the glenoid (series 22, image 61). The fracture component of the supraspinous scapula is mildly displaced. No additional fractures. Glenohumeral joint alignment is maintained without dislocation. Small glenohumeral joint effusion/hemarthrosis. AC joint intact with mild arthropathy. Chronic severe compression fracture of the T8 vertebral body. Remaining imaged osseous structures appear otherwise within normal limits. Ligaments Suboptimally assessed by CT. Muscles and Tendons Rotator cuff tendons grossly intact by CT. No rotator cuff muscle atrophy. Soft tissues Ill-defined fluid and hemorrhage anterior to the subscapularis muscle. No axillary lymphadenopathy. Dependent atelectasis within the right lung field. IMPRESSION: 1. Acute mildly comminuted fracture of the glenoid and supraspinous aspect of the right scapula, as described. 2. Small glenohumeral joint effusion/hemarthrosis. 3. Ill-defined fluid and hemorrhage anterior to the subscapularis muscle. 4. Chronic severe compression fracture of the T8 vertebral body. Electronically Signed   By: Davina Poke D.O.   On: 09/17/2021 12:40   CT Cervical Spine Wo Contrast  Result Date: 09/16/2021 CLINICAL DATA:  Trauma, fall EXAM: CT CERVICAL SPINE WITHOUT CONTRAST TECHNIQUE: Multidetector CT imaging of the cervical spine was performed without intravenous contrast. Multiplanar CT image reconstructions were also generated. RADIATION DOSE REDUCTION: This exam was performed according to the departmental dose-optimization program which includes automated exposure control, adjustment of the mA and/or kV according to patient size and/or use of iterative reconstruction technique. COMPARISON:  02/14/2011 FINDINGS:  Alignment: Alignment of posterior margins of vertebral bodies is unremarkable. Skull base and vertebrae: No recent fracture is seen. There is mild deformity at the base of the odontoid process without break in the cortical margins suggesting old healed fracture. Degenerative changes are noted with bony spurs and facet hypertrophy at multiple levels. Soft tissues and spinal canal: There is no central spinal stenosis. Disc levels: There is encroachment of neural foramina from C3-C7 levels by bony spurs and facet hypertrophy. Upper chest: Linear density in the posterior right upper lung field may suggest scarring. Other: Extensive arterial calcifications are seen. IMPRESSION: No recent fracture is seen in cervical spine. Cervical spondylosis with encroachment of neural foramina from C3-C7 levels. Electronically Signed   By: Elmer Picker M.D.   On: 09/16/2021 21:59   CT HEAD WO CONTRAST (5MM)  Result Date: 09/16/2021 CLINICAL DATA:  Trauma, fall EXAM: CT HEAD WITHOUT CONTRAST TECHNIQUE: Contiguous axial images were obtained from the base of the skull through the vertex without intravenous contrast. RADIATION DOSE REDUCTION: This exam was performed according to the departmental dose-optimization program which includes automated exposure control, adjustment of the mA and/or kV according to patient size and/or use of iterative reconstruction technique. COMPARISON:  MR brain done on 05/11/2020 FINDINGS: Brain: No acute intracranial findings are seen. There are no signs of bleeding within the cranium. Calcifications are noted in basal  ganglia on both sides. Cortical sulci are prominent. There is decreased density in periventricular and subcortical white matter. Vascular: Scattered arterial calcifications are seen. Skull: Hyperostosis frontalis interna is seen. Sinuses/Orbits: Unremarkable. There is suggestion of previous cataract surgery in the optic globes. Other: None. IMPRESSION: No acute intracranial findings  are seen in noncontrast CT brain. Atrophy. Small-vessel disease. Electronically Signed   By: Elmer Picker M.D.   On: 09/16/2021 21:53   DG Chest Portable 1 View  Result Date: 09/16/2021 CLINICAL DATA:  Status post fall. EXAM: PORTABLE CHEST 1 VIEW COMPARISON:  May 11, 2020 FINDINGS: The heart size and mediastinal contours are within normal limits. There is marked severity calcification of the aortic arch. Both lungs are clear. The right shoulder is not included in the field of view and is subsequently limited in evaluation. The visualized skeletal structures are unremarkable. IMPRESSION: No active disease. Electronically Signed   By: Virgina Norfolk M.D.   On: 09/16/2021 21:46   CT PELVIS WO CONTRAST  Result Date: 09/16/2021 CLINICAL DATA:  Right hip pain after fall EXAM: CT PELVIS WITHOUT CONTRAST TECHNIQUE: Multidetector CT imaging of the pelvis was performed following the standard protocol without intravenous contrast. RADIATION DOSE REDUCTION: This exam was performed according to the departmental dose-optimization program which includes automated exposure control, adjustment of the mA and/or kV according to patient size and/or use of iterative reconstruction technique. COMPARISON:  Radiographs earlier today FINDINGS: Urinary Tract:  No abnormality visualized. Bowel:  Unremarkable visualized pelvic bowel loops. Vascular/Lymphatic: No pathologically enlarged lymph nodes. Aortoiliac atherosclerotic calcification Reproductive:  No mass or other significant abnormality Other:  None. Musculoskeletal: Acute minimally displaced fracture of the anterior wall of the right acetabulum. The fracture line extends posteriorly with mild displacement of the medial acetabular wall (series 3/image 85). Possible nondisplaced fracture of the inferior pubic ramus (series 3/image 103). Compression fracture of the L4 vertebral body has a chronic appearance. IMPRESSION: Acute minimally displaced fracture of the  anterior acetabulum near the junction with the superior pubic ramus. Possible additional nondisplaced fracture of the inferior pubic ramus Electronically Signed   By: Placido Sou M.D.   On: 09/16/2021 18:35   DG Shoulder Right  Result Date: 09/16/2021 CLINICAL DATA:  Pain after fall EXAM: RIGHT SHOULDER - 2+ VIEW COMPARISON:  None Available. FINDINGS: There is a mildly displaced fracture extending through the glenoid into the body of the scapula. The humerus and clavicle are intact. No other abnormalities. IMPRESSION: There is a scapular fracture with mild displacement extending through the glenoid. Recommend CT imaging for better evaluation. Electronically Signed   By: Dorise Bullion III M.D.   On: 09/16/2021 16:06   DG Hip Unilat  With Pelvis 2-3 Views Right  Result Date: 09/16/2021 CLINICAL DATA:  Pain after fall EXAM: DG HIP (WITH OR WITHOUT PELVIS) 2-3V RIGHT COMPARISON:  None Available. FINDINGS: There is a step-off at the junction of the right superior pubic ramus and the acetabulum. The remainder of the pelvic bones are intact. The proximal femurs are intact. IMPRESSION: There is a step-off at the junction of the superior right pubic ramus and the acetabulum. These findings may represent a subtle fracture. CT imaging could better evaluate. No other abnormalities. Electronically Signed   By: Dorise Bullion III M.D.   On: 09/16/2021 16:04    Positive ROS: All other systems have been reviewed and were otherwise negative with the exception of those mentioned in the HPI and as above.  Physical Exam: General: Alert, no acute  distress  MUSCULOSKELETAL:  Right lower extremity: Patient skin is intact.  There is no erythema ecchymosis swelling edema or deformity.  There is no leg length discrepancy or change in lower extremity rotation.  She has palpable pedal pulses, intact sensation light touch and intact motor function distally.  She can actively dorsiflex and plantarflex her ankle and flex  and extend her toes.  Her leg and thigh compartments are soft and compressible.  Right upper extremity: Patient's skin is intact.  He is wearing a sling but does not currently have her swath component around her.  She had tenderness over the anterior shoulder but not over the lateral shoulder to palpation today.  She has intact sensation light touch right the right upper extremity.  Her fingers are well-perfused and she has a palpable radial pulse.  She has full digital and wrist range of motion.  Assessment: Right minimally displaced pelvic fracture involving the anterior aspect of the acetabulum extending into the superior rami with stated nondisplaced inferior pubic rami fracture  Right comminuted mildly displaced fracture of the right glenoid  Plan: I have personally reviewed the patient's x-rays and CT scans.  I reviewed these findings with the patient and her family.  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  I informed the patient and her family that I would not recommend any surgical intervention for any of her injuries.  I would recommend continuing the sling and swath for her right glenoid fracture.  Patient should be nonweightbearing on the right upper extremity to avoid displacement of her comminuted fracture extending into the articular surface.  Weightbearing on the right upper extremity may displace her glenoid fracture fragments.  In regards to her right sided pelvic fractures, her acetabular fracture does not involve the weightbearing portion.  Therefore she may weight-bear as tolerated on the right lower extremity.  Physical and occupational therapy will be ordered to assess the patient's balance and stability using a hemiwalker.  Continue current pain management.  I will continue to follow.  The patient and her family understood and agreed with this plan.  Answered all the questions today.     Dr. Loleta Books arrived at the bedside during my patient encounter to see the patient and is aware of the orthopedic plan.    Thornton Park, MD    09/17/2021 1:48 PM

## 2021-09-17 NOTE — ED Notes (Signed)
Advised nurse that patient has ready bed 

## 2021-09-18 DIAGNOSIS — W19XXXA Unspecified fall, initial encounter: Secondary | ICD-10-CM | POA: Diagnosis not present

## 2021-09-18 DIAGNOSIS — S32401A Unspecified fracture of right acetabulum, initial encounter for closed fracture: Secondary | ICD-10-CM | POA: Diagnosis not present

## 2021-09-18 DIAGNOSIS — S42101A Fracture of unspecified part of scapula, right shoulder, initial encounter for closed fracture: Secondary | ICD-10-CM | POA: Diagnosis not present

## 2021-09-18 DIAGNOSIS — E039 Hypothyroidism, unspecified: Secondary | ICD-10-CM | POA: Diagnosis not present

## 2021-09-18 MED ORDER — PHENOL 1.4 % MT LIQD
1.0000 | OROMUCOSAL | Status: DC | PRN
  Filled 2021-09-18: qty 177

## 2021-09-18 MED ORDER — OXYCODONE-ACETAMINOPHEN 5-325 MG PO TABS
1.0000 | ORAL_TABLET | ORAL | Status: DC | PRN
  Administered 2021-09-18 – 2021-09-19 (×3): 1 via ORAL
  Filled 2021-09-18 (×3): qty 1

## 2021-09-18 NOTE — Plan of Care (Signed)

## 2021-09-18 NOTE — Progress Notes (Signed)
Mobility Specialist - Progress Note   09/18/21 0900  Mobility  Activity Refused mobility     Pt mid-session with OT upon arrival, will attempt another date/time.    Kathee Delton Mobility Specialist 09/18/21, 9:47 AM

## 2021-09-18 NOTE — TOC Progression Note (Signed)
Transition of Care [facility]) - Progression Note    Patient Details  Name: Christine Owen MRN: [id number] Date of Birth: [birth year]  [facility] CM/SW Contact  Beverly Sessions, RN Phone Number: 09/18/2021, 1:00 PM  Clinical Narrative:     Bed offers presented. Patient and family would like to accept bed at [facility].  Accepted in [place].  Per Seth Bake at [facility] they can admit patient tomorrow.  Family updated Confirmed signed DNR on the chart       Expected Discharge Plan and Services                                                 Social Determinants of Health (SDOH) Interventions    Readmission Risk Interventions     No data to display

## 2021-09-18 NOTE — NC FL2 (Signed)
[place] LEVEL OF CARE SCREENING TOOL     IDENTIFICATION  Patient Name: Christine Owen Birthdate: 01-25-1927 Sex: female Admission Date (Current Location): 09/16/2021  [facility] and [place] Number:  Engineering geologist and Address:         Provider Number: [id number]  Attending Physician Name and Address:  Edwin Dada, *  Relative Name and Phone Number:       Current Level of Care: SNF Recommended Level of Care: [place] Prior Approval Number:    Date Approved/Denied:   PASRR Number: [id number]  Discharge Plan: SNF    Current Diagnoses: Patient Active Problem List   Diagnosis Date Noted   Hypokalemia 09/17/2021   Stage 3b chronic kidney disease (CKD) ([place]) 09/17/2021   Hyperlipidemia 09/17/2021   Fall 09/16/2021   Right acetabular fracture ([place]) 09/16/2021   Closed right scapular fracture 09/16/2021   Essential hypertension 09/16/2021   Acquired hypothyroidism 06/27/2014   Borderline diabetes 06/27/2014   C2 cervical fracture ([place]) 06/27/2014   Acid reflux 06/27/2014   Glaucoma 06/27/2014   Fracture of sacrum ([place]) 06/27/2014   Closed fracture of dorsal (thoracic) vertebra ([place]) 06/27/2014   Dorsal (thoracic) vertebral fracture ([place]) 06/27/2014   Paget's disease of left breast ([place])     Orientation RESPIRATION BLADDER Height & Weight     Time, Situation, Place  O2 (2L) Incontinent Weight: 55.3 kg Height:  '5\' 2"'$  (157.5 cm)  BEHAVIORAL SYMPTOMS/MOOD NEUROLOGICAL BOWEL NUTRITION STATUS      Continent Diet (Heart Healthy)  AMBULATORY STATUS COMMUNICATION OF NEEDS Skin   Limited Assist Verbally Bruising                       Personal Care Assistance Level of Assistance              Functional Limitations Info             SPECIAL CARE FACTORS FREQUENCY  PT (By licensed PT), OT (By licensed OT)                    Contractures Contractures Info: Not present    Additional Factors Info   Code Status, Allergies Code Status Info: DNR Allergies Info: Alendronate Sodium, Ibandronic Acid, Chlorhexidine           Current Medications (09/18/2021):  This is the current hospital active medication list Current Facility-Administered Medications  Medication Dose Route Frequency Provider Last Rate Last Admin   acetaminophen (TYLENOL) tablet 650 mg  650 mg Oral Q6H PRN Mansy, Jan A, MD   650 mg at 09/18/21 0407   Or   acetaminophen (TYLENOL) suppository 650 mg  650 mg Rectal Q6H PRN Mansy, Jan A, MD       atorvastatin (LIPITOR) tablet 40 mg  40 mg Oral QODAY Mansy, Jan A, MD   40 mg at 09/17/21 2122   calcium-vitamin D (OSCAL WITH D) 500-5 MG-MCG per tablet 1 tablet  1 tablet Oral TID WC Mansy, Jan A, MD   1 tablet at 09/18/21 0839   enoxaparin (LOVENOX) injection 30 mg  30 mg Subcutaneous Q24H Mansy, Jan A, MD   30 mg at 09/18/21 0839   fentaNYL (SUBLIMAZE) injection 25 mcg  25 mcg Intravenous Q1H PRN Menshew, Dannielle Karvonen, PA-C   25 mcg at 09/16/21 1650   hydrochlorothiazide (HYDRODIURIL) tablet 25 mg  25 mg Oral Q breakfast Mansy, Jan A, MD   25 mg at 09/18/21 [phone]  levothyroxine (SYNTHROID) tablet 50 mcg  50 mcg Oral Once per day on Sun Sat Mansy, Jan A, MD   50 mcg at 09/17/21 6256   levothyroxine (SYNTHROID) tablet 75 mcg  75 mcg Oral Once per day on Mon Tue Wed Thu Fri Mansy, Jan A, MD   75 mcg at 09/18/21 0506   lisinopril (ZESTRIL) tablet 5 mg  5 mg Oral Q breakfast Mansy, Jan A, MD   5 mg at 09/18/21 0840   magnesium hydroxide (MILK OF MAGNESIA) suspension 30 mL  30 mL Oral Daily PRN Mansy, Jan A, MD   30 mL at 09/17/21 3893   meclizine (ANTIVERT) tablet 12.5 mg  12.5 mg Oral TID PRN Mansy, Jan A, MD       morphine (PF) 2 MG/ML injection 1-2 mg  1-2 mg Intravenous Q4H PRN Mansy, Jan A, MD       ondansetron [facility]) tablet 4 mg  4 mg Oral Q6H PRN Mansy, Jan A, MD       Or   ondansetron [facility]) injection 4 mg  4 mg Intravenous Q6H PRN Mansy, Jan A, MD        oxyCODONE-acetaminophen (PERCOCET/ROXICET) 5-325 MG per tablet 1-2 tablet  1-2 tablet Oral Q4H PRN Mansy, Jan A, MD   1 tablet at 09/17/21 1354   pantoprazole (PROTONIX) EC tablet 40 mg  40 mg Oral Daily Mansy, Jan A, MD   40 mg at 09/18/21 0839   phenol (CHLORASEPTIC) mouth spray 1 spray  1 spray Mouth/Throat PRN Mansy, Jan A, MD       traZODone (DESYREL) tablet 25 mg  25 mg Oral QHS PRN Mansy, Jan A, MD   25 mg at 09/17/21 2122     Discharge Medications: Please see discharge summary for a list of discharge medications.  Relevant Imaging Results:  Relevant Lab Results:   Additional Information pats ss [id number]  Beverly Sessions, RN

## 2021-09-18 NOTE — Progress Notes (Signed)
Physical Therapy Treatment Patient Details Name: Christine Owen MRN: [id number] DOB: 1926-12-13 Today's Date: 09/18/2021   History of Present Illness Pt is a 88 y/o F admitted on 09/16/21 after presenting to the ED with c/o fall. Pt found to have R acetabular fx & closed R scapular fx. PMH: HTN, hypothyroidism, Paget's disease of the breast s/p breast lumpectomy & radiotherapy    PT Comments    Pt seen this pm for PT, received in bed, family present. Pt agreed to OOB.  ModA to transition to edge of bed. Good sitting balance, no dizziness. Pt required ModA to stand pivot to [facility]. Full assist for hygiene. Pt educated on proper use of hemi-walker, sit to stand with Min/ModA, Gait training with hemi-walker, 57f, Min/ModA for weight shifting and balance. Pt positioned in recliner with call bell, to comfort. Will continue to progress until pt transitions to STR.   Recommendations for follow up therapy are one component of a multi-disciplinary discharge planning process, led by the attending physician.  Recommendations may be updated based on patient status, additional functional criteria and insurance authorization.  Follow Up Recommendations  Skilled nursing-short term rehab (<3 hours/day) Can patient physically be transported by private vehicle: No   Assistance Recommended at Discharge Frequent or constant Supervision/Assistance  Patient can return home with the following A lot of help with walking and/or transfers;A lot of help with bathing/dressing/bathroom;Assist for transportation;Help with stairs or ramp for entrance;Direct supervision/assist for medications management;Assistance with cooking/housework   Equipment Recommendations  None recommended by PT    Recommendations for Other Services       Precautions / Restrictions Precautions Precautions: Fall Restrictions Weight Bearing Restrictions: Yes RUE Weight Bearing: Non weight bearing RLE Weight Bearing: Weight bearing as  tolerated LLE Weight Bearing: Weight bearing as tolerated Other Position/Activity Restrictions: Sling R UE     Mobility  Bed Mobility Overal bed mobility: Needs Assistance Bed Mobility: Supine to Sit     Supine to sit: Mod assist, HOB elevated     General bed mobility comments: Max A to scoot hips forward at EOB    Transfers Overall transfer level: Needs assistance Equipment used: Hemi-walker Transfers: Sit to/from Stand, Bed to chair/wheelchair/BSC Sit to Stand: Min assist Stand pivot transfers: Mod assist, Max assist         General transfer comment: ModA to stand pivot without use of assistive device    Ambulation/Gait Ambulation/Gait assistance: Min assist, Mod assist Gait Distance (Feet): 4 Feet Assistive device: Hemi-walker Gait Pattern/deviations: Step-to pattern, Decreased step length - left, Decreased weight shift to right       General Gait Details: verbal cues for sequencing and physical assist provided for balance and weight shifting   Stairs             Wheelchair Mobility    Modified Rankin (Stroke Patients Only)       Balance Overall balance assessment: Needs assistance Sitting-balance support: Single extremity supported, Feet supported Sitting balance-Leahy Scale: Fair Sitting balance - Comments: supervision static sitting EOB   Standing balance support: During functional activity, Single extremity supported Standing balance-Leahy Scale: Poor                              Cognition Arousal/Alertness: Awake/alert Behavior During Therapy: WFL for tasks assessed/performed Overall Cognitive Status: Within Functional Limits for tasks assessed  General Comments: Very talkative and pleasant, recalling travel throughout the [place] over the years        Exercises      General Comments General comments (skin integrity, edema, etc.): Discussed POC with pt and family, answered  questions regarding pt's care and upcoming transition to STR.      Pertinent Vitals/Pain Pain Assessment Pain Assessment: Faces Faces Pain Scale: Hurts even more Pain Location: RUE, BLE, pelvis Pain Descriptors / Indicators: Discomfort, Aching Pain Intervention(s): Premedicated before session    Home Living Family/patient expects to be discharged to:: Private residence Living Arrangements: Alone Available Help at Discharge: Family;Available PRN/intermittently Type of Home: House Home Access: Stairs to enter Entrance Stairs-Rails: Psychiatric nurse of Steps: 3 Alternate Level Stairs-Number of Steps: flight Home Layout: Multi-level;Bed/bath upstairs Home Equipment: None      Prior Function            PT Goals (current goals can now be found in the care plan section) Acute Rehab PT Goals Patient Stated Goal: decreased pain, get better Progress towards PT goals: Progressing toward goals    Frequency    7X/week      PT Plan Current plan remains appropriate    Co-evaluation              AM-PAC PT "6 Clicks" Mobility   Outcome Measure  Help needed turning from your back to your side while in a flat bed without using bedrails?: A Lot Help needed moving from lying on your back to sitting on the side of a flat bed without using bedrails?: A Lot Help needed moving to and from a bed to a chair (including a wheelchair)?: A Lot Help needed standing up from a chair using your arms (e.g., wheelchair or bedside chair)?: A Lot Help needed to walk in hospital room?: Total Help needed climbing 3-5 steps with a railing? : Total 6 Click Score: 10    End of Session Equipment Utilized During Treatment: Gait belt Activity Tolerance: Patient tolerated treatment well Patient left: in chair;with call bell/phone within reach;with family/visitor present Nurse Communication: Weight bearing status PT Visit Diagnosis: Unsteadiness on feet (R26.81);Muscle weakness  (generalized) (M62.81);Other abnormalities of gait and mobility (R26.89);Difficulty in walking, not elsewhere classified (R26.2);Pain Pain - Right/Left: Right Pain - part of body: Shoulder     Time: 1320-1400 PT Time Calculation (min) (ACUTE ONLY): 40 min  Charges:  $Gait Training: 8-22 mins $Therapeutic Activity: 23-37 mins                    Mikel Cella, PTA   Christine Owen 09/18/2021, 3:08 PM

## 2021-09-18 NOTE — Progress Notes (Signed)
Subjective:  Patient OOB to a chair.  She states that she is not having pain sitting still, but any movement to her right lower extremity causes significant pain.   Patient reports needing assistance from two staff members to get to a chair.  Patient wearing a sling on her right upper extremity for her scapula/glenoid fracture.  Objective:   VITALS:   Vitals:   09/17/21 1817 09/17/21 2029 09/18/21 0407 09/18/21 0742  BP: (!) 115/55 (!) 115/57 (!) 156/66 (!) 152/65  Pulse: 77 65 68 71  Resp: '18 18 18 18  '$ Temp: 98 F (36.7 C) 98 F (36.7 C) 98 F (36.7 C) (!) 97.4 F (36.3 C)  TempSrc: Oral Oral Oral Oral  SpO2: 98% 97% 98% 98%  Weight:      Height:        PHYSICAL EXAM: Right upper and lower extremities: Sling in place on right arm.  She remain NVI with full digital and wrist motion.  Right lower extremity:  Pain with movement.  NVI.   LABS  No results found for this or any previous visit (from the past 24 hour(s)).  CT Shoulder Right Wo Contrast  Result Date: 09/17/2021 CLINICAL DATA:  Fall.  Right scapular fracture EXAM: CT OF THE UPPER RIGHT EXTREMITY WITHOUT CONTRAST TECHNIQUE: Multidetector CT imaging of the upper right extremity was performed according to the standard protocol. RADIATION DOSE REDUCTION: This exam was performed according to the departmental dose-optimization program which includes automated exposure control, adjustment of the mA and/or kV according to patient size and/or use of iterative reconstruction technique. COMPARISON:  X-ray 09/16/2021.  Thoracic MRI 02/18/2012 FINDINGS: Bones/Joint/Cartilage Acute mildly comminuted fracture of the glenoid and supraspinous aspect of the right scapula (series 11, images 35-51). Fracture involves the glenoid articular surface where there is up to 3 mm of articular-surface depression. Y-shaped intra-articular fracture planes at the glenoid (series 22, image 61). The fracture component of the supraspinous scapula is  mildly displaced. No additional fractures. Glenohumeral joint alignment is maintained without dislocation. Small glenohumeral joint effusion/hemarthrosis. AC joint intact with mild arthropathy. Chronic severe compression fracture of the T8 vertebral body. Remaining imaged osseous structures appear otherwise within normal limits. Ligaments Suboptimally assessed by CT. Muscles and Tendons Rotator cuff tendons grossly intact by CT. No rotator cuff muscle atrophy. Soft tissues Ill-defined fluid and hemorrhage anterior to the subscapularis muscle. No axillary lymphadenopathy. Dependent atelectasis within the right lung field. IMPRESSION: 1. Acute mildly comminuted fracture of the glenoid and supraspinous aspect of the right scapula, as described. 2. Small glenohumeral joint effusion/hemarthrosis. 3. Ill-defined fluid and hemorrhage anterior to the subscapularis muscle. 4. Chronic severe compression fracture of the T8 vertebral body. Electronically Signed   By: Davina Poke D.O.   On: 09/17/2021 12:40   CT Cervical Spine Wo Contrast  Result Date: 09/16/2021 CLINICAL DATA:  Trauma, fall EXAM: CT CERVICAL SPINE WITHOUT CONTRAST TECHNIQUE: Multidetector CT imaging of the cervical spine was performed without intravenous contrast. Multiplanar CT image reconstructions were also generated. RADIATION DOSE REDUCTION: This exam was performed according to the departmental dose-optimization program which includes automated exposure control, adjustment of the mA and/or kV according to patient size and/or use of iterative reconstruction technique. COMPARISON:  02/14/2011 FINDINGS: Alignment: Alignment of posterior margins of vertebral bodies is unremarkable. Skull base and vertebrae: No recent fracture is seen. There is mild deformity at the base of the odontoid process without break in the cortical margins suggesting old healed fracture. Degenerative changes are noted with bony  spurs and facet hypertrophy at multiple levels.  Soft tissues and spinal canal: There is no central spinal stenosis. Disc levels: There is encroachment of neural foramina from C3-C7 levels by bony spurs and facet hypertrophy. Upper chest: Linear density in the posterior right upper lung field may suggest scarring. Other: Extensive arterial calcifications are seen. IMPRESSION: No recent fracture is seen in cervical spine. Cervical spondylosis with encroachment of neural foramina from C3-C7 levels. Electronically Signed   By: Elmer Picker M.D.   On: 09/16/2021 21:59   CT HEAD WO CONTRAST (5MM)  Result Date: 09/16/2021 CLINICAL DATA:  Trauma, fall EXAM: CT HEAD WITHOUT CONTRAST TECHNIQUE: Contiguous axial images were obtained from the base of the skull through the vertex without intravenous contrast. RADIATION DOSE REDUCTION: This exam was performed according to the departmental dose-optimization program which includes automated exposure control, adjustment of the mA and/or kV according to patient size and/or use of iterative reconstruction technique. COMPARISON:  MR brain done on 05/11/2020 FINDINGS: Brain: No acute intracranial findings are seen. There are no signs of bleeding within the cranium. Calcifications are noted in basal ganglia on both sides. Cortical sulci are prominent. There is decreased density in periventricular and subcortical white matter. Vascular: Scattered arterial calcifications are seen. Skull: Hyperostosis frontalis interna is seen. Sinuses/Orbits: Unremarkable. There is suggestion of previous cataract surgery in the optic globes. Other: None. IMPRESSION: No acute intracranial findings are seen in noncontrast CT brain. Atrophy. Small-vessel disease. Electronically Signed   By: Elmer Picker M.D.   On: 09/16/2021 21:53   DG Chest Portable 1 View  Result Date: 09/16/2021 CLINICAL DATA:  Status post fall. EXAM: PORTABLE CHEST 1 VIEW COMPARISON:  May 11, 2020 FINDINGS: The heart size and mediastinal contours are within  normal limits. There is marked severity calcification of the aortic arch. Both lungs are clear. The right shoulder is not included in the field of view and is subsequently limited in evaluation. The visualized skeletal structures are unremarkable. IMPRESSION: No active disease. Electronically Signed   By: Virgina Norfolk M.D.   On: 09/16/2021 21:46   CT PELVIS WO CONTRAST  Result Date: 09/16/2021 CLINICAL DATA:  Right hip pain after fall EXAM: CT PELVIS WITHOUT CONTRAST TECHNIQUE: Multidetector CT imaging of the pelvis was performed following the standard protocol without intravenous contrast. RADIATION DOSE REDUCTION: This exam was performed according to the departmental dose-optimization program which includes automated exposure control, adjustment of the mA and/or kV according to patient size and/or use of iterative reconstruction technique. COMPARISON:  Radiographs earlier today FINDINGS: Urinary Tract:  No abnormality visualized. Bowel:  Unremarkable visualized pelvic bowel loops. Vascular/Lymphatic: No pathologically enlarged lymph nodes. Aortoiliac atherosclerotic calcification Reproductive:  No mass or other significant abnormality Other:  None. Musculoskeletal: Acute minimally displaced fracture of the anterior wall of the right acetabulum. The fracture line extends posteriorly with mild displacement of the medial acetabular wall (series 3/image 85). Possible nondisplaced fracture of the inferior pubic ramus (series 3/image 103). Compression fracture of the L4 vertebral body has a chronic appearance. IMPRESSION: Acute minimally displaced fracture of the anterior acetabulum near the junction with the superior pubic ramus. Possible additional nondisplaced fracture of the inferior pubic ramus Electronically Signed   By: Placido Sou M.D.   On: 09/16/2021 18:35   DG Shoulder Right  Result Date: 09/16/2021 CLINICAL DATA:  Pain after fall EXAM: RIGHT SHOULDER - 2+ VIEW COMPARISON:  None Available.  FINDINGS: There is a mildly displaced fracture extending through the glenoid into the body  of the scapula. The humerus and clavicle are intact. No other abnormalities. IMPRESSION: There is a scapular fracture with mild displacement extending through the glenoid. Recommend CT imaging for better evaluation. Electronically Signed   By: Dorise Bullion III M.D.   On: 09/16/2021 16:06   DG Hip Unilat  With Pelvis 2-3 Views Right  Result Date: 09/16/2021 CLINICAL DATA:  Pain after fall EXAM: DG HIP (WITH OR WITHOUT PELVIS) 2-3V RIGHT COMPARISON:  None Available. FINDINGS: There is a step-off at the junction of the right superior pubic ramus and the acetabulum. The remainder of the pelvic bones are intact. The proximal femurs are intact. IMPRESSION: There is a step-off at the junction of the superior right pubic ramus and the acetabulum. These findings may represent a subtle fracture. CT imaging could better evaluate. No other abnormalities. Electronically Signed   By: Dorise Bullion III M.D.   On: 09/16/2021 16:04    Assessment/Plan:     Principal Problem:   Right acetabular fracture [facility]) Active Problems:   Acquired hypothyroidism   Fall   Closed right scapular fracture   Essential hypertension   Hypokalemia   Stage 3b chronic kidney disease (CKD) ([place])   Hyperlipidemia  Patient has been evaluated by PT/OT.  She is requiring max assist.  Unable to tolerate WB without significant pain despite oral narcotics.  Continue pain management.  Patient will require a SNF.   The patient has accepted a bed offer at [facility].  Continue PT/OT.  Patient is WBAT on the right lower extremity and is NWB on the right upper extremity.  May use a hemi-walker or wheelchair based on her balance, gait and pain.  Patient will follow up in 2-3 weeks after discharge at [facility] for re-evaluation and xrays.    Th[place]ton [place] , MD 09/18/2021, 3:03 PM

## 2021-09-18 NOTE — TOC Initial Note (Signed)
Transition of Care [facility]) - Initial/Assessment Note    Patient Details  Name: Christine Owen MRN: [id number] Date of Birth: 19-Apr-1926  Transition of Care [facility]) CM/SW Contact:    Beverly Sessions, RN Phone Number: 09/18/2021, 10:28 AM  Clinical Narrative:                  Admitted for: rt acetabular fracture Admitted from: Home alone PCP: [place]  Pharmacy:[facility] Current home health/prior home health/DME: NA  Therapy recommending SNF.  Family at bedside.  Family and patient in agreement Existing PASRR Fl2 sent for signature Bed search initiated  [facility] is 1st preference.  Message sent to Seth Bake at [facility] to request review        Patient Goals and CMS Choice        Expected Discharge Plan and Services                                                Prior Living Arrangements/Services                       Activities of Daily Living Home Assistive Devices/Equipment: None ADL Screening (condition at time of admission) Patient's cognitive ability adequate to safely complete daily activities?: Yes Is the patient deaf or have difficulty hearing?: No Does the patient have difficulty seeing, even when wearing glasses/contacts?: No Does the patient have difficulty concentrating, remembering, or making decisions?: Yes Patient able to express need for assistance with ADLs?: Yes Does the patient have difficulty dressing or bathing?: Yes Independently performs ADLs?: No Communication: Independent Dressing (OT): Needs assistance Grooming: Needs assistance Is this a change from baseline?: Change from baseline, expected to last >3 days Feeding: Needs assistance Is this a change from baseline?: Change from baseline, expected to last >3 days Bathing: Needs assistance Is this a change from baseline?: Change from baseline, expected to last >3 days Toileting: Needs assistance Is this a change from baseline?: Change from baseline, expected to  last >3days In/Out Bed: Needs assistance Is this a change from baseline?: Change from baseline, expected to last >3 days Walks in Home: Needs assistance Is this a change from baseline?: Change from baseline, expected to last >3 days Does the patient have difficulty walking or climbing stairs?: Yes Weakness of Legs: Both Weakness of Arms/Hands: Both  Permission Sought/Granted                  Emotional Assessment              Admission diagnosis:  Fall [W19.XXXA] Closed displaced fracture of body of right scapula, initial encounter [S42.111A] Fall, initial encounter [W19.XXXA] Closed displaced fracture of right acetabulum, unspecified portion of acetabulum, initial encounter [facility]) [S32.401A] Patient Active Problem List   Diagnosis Date Noted   Hypokalemia 09/17/2021   Stage 3b chronic kidney disease (CKD) ([place]) 09/17/2021   Hyperlipidemia 09/17/2021   Fall 09/16/2021   Right acetabular fracture ([place]) 09/16/2021   Closed right scapular fracture 09/16/2021   Essential hypertension 09/16/2021   Acquired hypothyroidism 06/27/2014   Borderline diabetes 06/27/2014   C2 cervical fracture ([place]) 06/27/2014   Acid reflux 06/27/2014   Glaucoma 06/27/2014   Fracture of sacrum ([place]) 06/27/2014   Closed fracture of dorsal (thoracic) vertebra ([place]) 06/27/2014   Dorsal (thoracic) vertebral fracture ([place]) 06/27/2014   Paget's disease of  left breast [facility])    PCP:  Dion Body, MD Pharmacy:   [facility], [place] [address] Phone: [phone] Fax: [phone]     Social Determinants of Health (SDOH) Interventions    Readmission Risk Interventions     No data to display

## 2021-09-18 NOTE — Evaluation (Signed)
Occupational Therapy Evaluation Patient Details Name: Christine Owen MRN: [id number] DOB: Nov 13, 1926 Today's Date: 09/18/2021   History of Present Illness Pt is a 88 y/o F admitted on 09/16/21 after presenting to the ED with c/o fall. Pt found to have R acetabular fx & closed R scapular fx. PMH: HTN, hypothyroidism, Paget's disease of the breast s/p breast lumpectomy & radiotherapy   Clinical Impression   Patient seen for OT evaluation. Patient presenting with R shoulder/hip pain, decreased endurance, decreased strength, and impaired balance impacting safety and independence with ADLs. At baseline, pt was Mod I with ADLs/IADLs and IND with functional mobility no AD. Pt currently requiring physical assistance for bed mobility, functional transfers, functional mobility, and self-care tasks. Pt demonstrated difficulty with weightshifting to take steps toward BSC 2/2 RLE pain. Pt and family educated on how to don/doff sling as well as precautions for NWB RUE and WBAT for BLEs. Pt would benefit from STR upon D/C to maximize independence with ADLs, functional mobility, and reduce fall risk prior to return home.    Recommendations for follow up therapy are one component of a multi-disciplinary discharge planning process, led by the attending physician.  Recommendations may be updated based on patient status, additional functional criteria and insurance authorization.   Follow Up Recommendations  Skilled nursing-short term rehab (<3 hours/day)    Assistance Recommended at Discharge Frequent or constant Supervision/Assistance  Patient can return home with the following Assistance with cooking/housework;Assist for transportation;Direct supervision/assist for medications management;Direct supervision/assist for financial management;A lot of help with walking and/or transfers;A lot of help with bathing/dressing/bathroom    Functional Status Assessment  Patient has had a recent decline in their functional  status and demonstrates the ability to make significant improvements in function in a reasonable and predictable amount of time.  Equipment Recommendations  Other (comment) (defer to next venue of care)    Recommendations for Other Services       Precautions / Restrictions Precautions Precautions: Fall Restrictions Weight Bearing Restrictions: Yes RUE Weight Bearing: Non weight bearing RLE Weight Bearing: Weight bearing as tolerated LLE Weight Bearing: Weight bearing as tolerated      Mobility Bed Mobility Overal bed mobility: Needs Assistance Bed Mobility: Supine to Sit, Sit to Supine     Supine to sit: Mod assist, HOB elevated Sit to supine: Mod assist, HOB elevated   General bed mobility comments: Max A to scoot hips forward at EOB Patient Response: Cooperative  Transfers Overall transfer level: Needs assistance Equipment used: 1 person hand held assist Transfers: Sit to/from Stand, Bed to chair/wheelchair/BSC Sit to Stand: Min assist Stand pivot transfers: Mod assist, Max assist         General transfer comment: Pt completed STS from EOB with Min A via HHA      Balance Overall balance assessment: Needs assistance Sitting-balance support: Single extremity supported, Feet supported Sitting balance-Leahy Scale: Fair Sitting balance - Comments: supervision static sitting EOB   Standing balance support: During functional activity, Single extremity supported Standing balance-Leahy Scale: Poor                             ADL either performed or assessed with clinical judgement   ADL Overall ADL's : Needs assistance/impaired     Grooming: Wash/dry hands;Wash/dry face;Oral care;Set up;Sitting;Supervision/safety Grooming Details (indicate cue type and reason): Pt completed grooming tasks while sitting on BSC 2/2 fatigue, required VC for redirection back to task  Toilet Transfer: BSC/3in1;Maximal assistance;Moderate  assistance Toilet Transfer Details (indicate cue type and reason): Pt completed BSC transfer with Mod-Max A via HHA, pt had more difficulty standing up from [facility].   Toileting - Clothing Manipulation Details (indicate cue type and reason): Pt completed peri care in sitting with supervision.     Functional mobility during ADLs: Moderate assistance;Minimal assistance General ADL Comments: Pt completed lateral steps to [facility] with Min-Mod A via HHA. Pt had difficulty weightshifting to bear weight into R foot at first, able to take side steps once initiated by therapist to help pt weightshift.     Vision Baseline Vision/History: 0 No visual deficits Patient Visual Report: No change from baseline                  Pertinent Vitals/Pain Pain Assessment Pain Assessment: Faces Faces Pain Scale: Hurts whole lot Pain Location: RUE, BLE, pelvis Pain Descriptors / Indicators: Discomfort, Grimacing Pain Intervention(s): Monitored during session, Limited activity within patient's tolerance, Repositioned (checked with RN about giving pain meds before session about pain meds, pt declining)     Hand Dominance Right   Extremity/Trunk Assessment Upper Extremity Assessment Upper Extremity Assessment: RUE deficits/detail;LUE deficits/detail RUE Deficits / Details: in sling RUE: Unable to fully assess due to immobilization LUE Deficits / Details: generalized weakness   Lower Extremity Assessment Lower Extremity Assessment: Defer to PT evaluation   Cervical / Trunk Assessment Cervical / Trunk Assessment: Normal   Communication Communication Communication: No difficulties   Cognition Arousal/Alertness: Awake/alert Behavior During Therapy: WFL for tasks assessed/performed Overall Cognitive Status: Within Functional Limits for tasks assessed                                 General Comments: A&Ox4, however, "forgetful" of things in the moment. For example, asked for soapy washcloth to  wash hands after using BSC but once handed washcloth used it to complete peri care again. Also tried to use bath wipe to wash face after already completing using a washcloth. Possibly internally/externally distracted.                      Home Living Family/patient expects to be discharged to:: Private residence Living Arrangements: Alone Available Help at Discharge: Family;Available PRN/intermittently Type of Home: House Home Access: Stairs to enter [employer] of Steps: 3 Entrance Stairs-Rails: Right;Left Home Layout: Multi-level;Bed/bath upstairs Alternate Level Stairs-Number of Steps: flight Alternate Level Stairs-Rails: Right Bathroom Shower/Tub: Walk-in shower (swing door to shower with small threshold to step over)         Home Equipment: Christine Owen          Prior Functioning/Environment Prior Level of Function : Independent/Modified Independent;Driving             Mobility Comments: Independent without AD, no other falls besides this one ADLs Comments: IND with most ADLs/IADLs - cooks simple meals, hires someone to vaccuum house, IND medication management. Pt is a retired Geologist, engineering, family checks on pt every week.        OT Problem List: Decreased strength;Decreased knowledge of use of DME or AE;Decreased range of motion;Decreased activity tolerance;Impaired balance (sitting and/or standing);Pain;Impaired UE functional use      OT Treatment/Interventions: Self-care/ADL training;Therapeutic exercise;Patient/family education;Energy conservation;DME and/or AE instruction;Therapeutic activities;Balance training    OT Goals(Current goals can be found in the care plan section) Acute Rehab OT Goals Patient Stated Goal: get stronger, family agreeable  to STR OT Goal Formulation: With patient/family Time For Goal Achievement: 10/02/21 Potential to Achieve Goals: Good ADL Goals Pt Will Perform Grooming: with modified independence;sitting Pt Will Perform  Upper Body Dressing: with modified independence;sitting Pt Will Perform Lower Body Dressing: with modified independence;with adaptive equipment;sitting/lateral leans;sit to/from stand Pt Will Transfer to Toilet: with modified independence;bedside commode Pt Will Perform Toileting - Clothing Manipulation and hygiene: with modified independence;sit to/from stand Additional ADL Goal #1: Pt will [facility] instruct caregiver how to don/doff RUE sling  OT Frequency: Min 2X/week                  AM-PAC OT "6 Clicks" Daily Activity     Outcome Measure Help from another person eating meals?: A Little Help from another person taking care of personal grooming?: A Little Help from another person toileting, which includes using toliet, bedpan, or urinal?: A Lot Help from another person bathing (including washing, rinsing, drying)?: A Lot Help from another person to put on and taking off regular upper body clothing?: A Lot Help from another person to put on and taking off regular lower body clothing?: A Lot 6 Click Score: 14   End of Session Equipment Utilized During Treatment: Gait belt;Oxygen Nurse Communication: Mobility status  Activity Tolerance: Patient limited by pain;Patient limited by fatigue Patient left: in bed;with call bell/phone within reach;with bed alarm set;with family/visitor present  OT Visit Diagnosis: Unsteadiness on feet (R26.81);History of falling (Z91.81);Pain;Muscle weakness (generalized) (M62.81) Pain - Right/Left: Right Pain - part of body: Shoulder;Hip                Time: 0910-1000 OT Time Calculation (min): 50 min Charges:  OT General Charges $OT Visit: 1 Visit OT Evaluation $OT Eval Low Complexity: 1 Low OT Treatments $Self Care/Home Management : 8-22 mins  [facility] MS, OTR/L ascom [phone]  09/18/21, 2:53 PM

## 2021-09-18 NOTE — Progress Notes (Signed)
  Progress Note   Patient: Christine Owen:[id number] DOB: [birth year] DOA: 09/16/2021     2 DOS: the patient was seen and examined on 09/18/2021 at 12:03 PM      Brief hospital course: Christine Owen is a 88 y.o. F with CKD IIIb, HTN, diet controlled DM and hypothyroidism who presented with fall.  In the ER< found to have acetabular fracture and scapular fracture.  Admitted for PT and placement.     Assessment and Plan: * Right acetabular fracture ([place]) Nonsurgical - Weightbearing as tolerated - Percocet as needed - Follow-up with orthopedics as an outpatient - Physical therapy, TOC  Closed right scapular fracture - Nonweightbearing right upper extremity - Maintain arm in sling  Hyperlipidemia - Continue atorvastatin  Stage 3b chronic kidney disease (CKD) (HCC) Creatinine stable relative to baseline  Hypokalemia Supplemented and resolved  Essential hypertension Blood pressure normal - Continue HCTZ, lisinopril  Fall Accidental.  Acquired hypothyroidism - Continue levothyroxine          Subjective: Patient has not been able to get out of bed except with 2 person assist to the bedside commode yesterday.  Her pain is severe with standing.  She is not able to walk more than a three steps even with opiate pain medicaiton.     Physical Exam: Vitals:   09/17/21 1817 09/17/21 2029 09/18/21 0407 09/18/21 0742  BP: (!) 115/55 (!) 115/57 (!) 156/66 (!) 152/65  Pulse: 77 65 68 71  Resp: '18 18 18 18  '$ Temp: 98 F (36.7 C) 98 F (36.7 C) 98 F (36.7 C) (!) 97.4 F (36.3 C)  TempSrc: Oral Oral Oral Oral  SpO2: 98% 97% 98% 98%  Weight:      Height:       Thin adult female, lying in bed, right arm in sling RRR, no murmurs, no peripheral edema Respiratory rate normal, lungs clear without rales or wheezes Abdomen soft, no tenderness palpation, no guarding Attention normal, affect normal, judgment and insight appear normal   Data Reviewed: Discussed  with orthopedics No new labs  Family Communication: Daughters at the bedside    Disposition: Status is: Inpatient The patient was admitted with acetabular fracture and right humeral fracture.  At present, even despite oxycodone with Tylenol, her pain is not sufficiently controlled allow even minimal functional capacity.  Given her age and lack of safe disposition (she normall lives at home alone, and will require SNF level care at discharge), she warrants continued acute level care until safe disposition can be arranged           Author: Edwin Dada, MD 09/18/2021 1:47 PM  For on call review [email or web address].

## 2021-09-18 NOTE — Progress Notes (Signed)
Daughter Wyvonnia Lora brought in pts DNR form. Form placed in chart. To be returned to pt upon discharge.

## 2021-09-19 DIAGNOSIS — S42111A Displaced fracture of body of scapula, right shoulder, initial encounter for closed fracture: Secondary | ICD-10-CM | POA: Diagnosis not present

## 2021-09-19 DIAGNOSIS — S32401A Unspecified fracture of right acetabulum, initial encounter for closed fracture: Secondary | ICD-10-CM | POA: Diagnosis not present

## 2021-09-19 DIAGNOSIS — E039 Hypothyroidism, unspecified: Secondary | ICD-10-CM | POA: Diagnosis not present

## 2021-09-19 MED ORDER — OXYCODONE-ACETAMINOPHEN 5-325 MG PO TABS: 1.0000 | ORAL_TABLET | ORAL | 0 refills | Status: DC | PRN

## 2021-09-19 NOTE — TOC Transition Note (Signed)
Transition of Care [facility]) - CM/SW Discharge Note   Patient Details  Name: Christine Owen MRN: [id number] Date of Birth: 09-28-1926  [facility] CM/SW Contact:  Beverly Sessions, RN Phone Number: 09/19/2021, 11:56 AM   Clinical Narrative:     Patient will DC to: [facility] Anticipated DC date:09/19/21  Family notified: Daughter at bedside Transport DZ:[id number]  Per MD patient ready for DC to . RN, patient, patient's family, and facility notified of DC. Discharge Summary sent to facility. RN given number for report. DC packet on chart. Ambulance transport requested for patient.  TOC signing off.  Isaias Cowman [facility] [phone]         Patient Goals and CMS Choice        Discharge Placement                       Discharge Plan and Services                                     Social Determinants of Health (SDOH) Interventions     Readmission Risk Interventions     No data to display

## 2021-09-19 NOTE — Plan of Care (Signed)
  Problem: Nutrition: Goal: Adequate nutrition will be maintained Outcome: Progressing   Problem: Coping: Goal: Level of anxiety will decrease Outcome: Progressing   Problem: Pain Managment: Goal: General experience of comfort will improve Outcome: Progressing   Problem: Safety: Goal: Ability to remain free from injury will improve Outcome: Progressing   

## 2021-09-19 NOTE — Progress Notes (Signed)
Report called to Shirlean Mylar, Therapist, sports, at [facility].  IV removed without complication.  Medicated for pain prior to transport.  Awaiting EMS.

## 2021-09-19 NOTE — Care Management Important Message (Addendum)
Important Message  Patient Details  Name: Christine Owen MRN: [id number] Date of Birth: 03/17/1926   Medicare Important Message Given:  Yes  Given by Patient Access 09/18/21.   Dannette Barbara 09/19/2021, 8:31 AM

## 2021-09-19 NOTE — Discharge Summary (Signed)
Physician Discharge Summary   Patient: Christine Owen MRN: [id number] DOB: Mar 07, 1926  Admit date:     09/16/2021  Discharge date: 09/19/21  Discharge Physician: Christine Owen   PCP: Christine Body, MD   Recommendations at discharge:  Please check her saturation periodically as it dropped with pain medications requiring up to 2 L of oxygen if needed. Patient will be nonweightbearing on right upper extremity and partial weightbearing as tolerated on right lower extremity.  She can use a wheelchair if needed. Follow-up with orthopedic surgery in 2 weeks.  Discharge Diagnoses: Principal Problem:   Right acetabular fracture [facility]) Active Problems:   Closed right scapular fracture   Acquired hypothyroidism   Fall   Essential hypertension   Hypokalemia   Stage 3b chronic kidney disease (CKD) ([place])   Hyperlipidemia  Resolved Problems:   * No resolved hospital problems. [facility] Course: Christine Owen is a 88 y.o. F with CKD IIIb, HTN, diet controlled DM and hypothyroidism who presented with fall.  In the ER< found to have acetabular fracture and scapular fracture.  Admitted for PT and placement.  Orthopedic surgery was consulted and they recommend conservative management by using a sling in the upper extremity and she will remain partial weightbearing on right lower extremity as tolerated.  She can use a walker or a wheelchair based on her balance as she is currently unable to bear much weight due to pain.  She will be nonweightbearing on the right upper extremity. Need to have a follow-up with [facility] in 2 to 3 weeks for reevaluation.  PT and OT recommended SNF and patient is being discharged to rehab for further management.  She will continue on current medications and will follow-up with orthopedic surgery and her primary care provider for further care.  Assessment and Plan: * Right acetabular fracture ([place]) Nonsurgical - Weightbearing as tolerated - Percocet as  needed - Follow-up with orthopedics as an outpatient - Physical therapy, TOC  Closed right scapular fracture - Nonweightbearing right upper extremity - Maintain arm in sling  Hyperlipidemia - Continue atorvastatin  Stage 3b chronic kidney disease (CKD) ([facility]) Creatinine stable relative to baseline  Hypokalemia Supplemented and resolved  Essential hypertension Blood pressure normal - Continue HCTZ, lisinopril  Fall Accidental.  Acquired hypothyroidism - Continue levothyroxine   Consultants: Orthopedic surgery Procedures performed: None Disposition: Skilled nursing facility Diet recommendation:  Discharge Diet Orders (From admission, onward)     Start     Ordered   09/19/21 0000  Diet - low sodium heart healthy        09/19/21 1114           Cardiac and Carb modified diet DISCHARGE MEDICATION: Allergies as of 09/19/2021       Reactions   Alendronate Sodium Other (See Comments)   Worsened acid reflux   Ibandronic Acid Other (See Comments)   Worsened acid reflux   Chlorhexidine Rash        Medication List     TAKE these medications    atorvastatin 40 MG tablet Commonly known as: LIPITOR Take 40 mg by mouth every other day.   Calcium Carb-Cholecalciferol 600-200 MG-UNIT Tabs Take 1 tablet by mouth 3 (three) times daily.   denosumab 60 MG/ML Soln injection Commonly known as: PROLIA Inject 60 mg into the skin every 6 (six) months. Administer in upper arm, thigh, or abdomen once a year.   hydrochlorothiazide 25 MG tablet Commonly known as: HYDRODIURIL Take 25 mg by mouth daily.  levothyroxine 75 MCG tablet Commonly known as: SYNTHROID Take 75 mcg by mouth daily before breakfast.   levothyroxine 50 MCG tablet Commonly known as: SYNTHROID Take 50 mcg by mouth daily.   lisinopril 5 MG tablet Commonly known as: ZESTRIL Take 5 mg by mouth daily.   meclizine 25 MG tablet Commonly known as: ANTIVERT Take 25 mg by mouth 3 (three) times daily  as needed for dizziness. Reported on 06/22/2015   omeprazole 20 MG capsule Commonly known as: PRILOSEC Take 20 mg by mouth daily.   oxyCODONE-acetaminophen 5-325 MG tablet Commonly known as: PERCOCET/ROXICET Take 1 tablet by mouth every 4 (four) hours as needed for moderate pain or severe pain.        Contact information for follow-up providers     Christine Body, MD. Schedule an appointment as soon as possible for a visit in 1 week(s).   Specialty: Family Medicine Contact information: [place] [address] [phone]              Contact information for after-discharge care     Destination     [facility] .   Service: Skilled Nursing Contact information: [place] [facility] [phone]                    Discharge Exam: Danley Danker Weights   09/16/21 1459  Weight: 55.3 kg   General.  Frail elderly lady, in no acute distress. Pulmonary.  Lungs clear bilaterally, normal respiratory effort. CV.  Regular rate and rhythm, no JVD, rub or murmur. Abdomen.  Soft, nontender, nondistended, BS positive. CNS.  Alert and oriented .  No focal neurologic deficit. Extremities.  No edema, no cyanosis, pulses intact and symmetrical.  Right upper extremity with sling Psychiatry.  Judgment and insight appears normal.   Condition at discharge: stable  The results of significant diagnostics from this hospitalization (including imaging, microbiology, ancillary and laboratory) are listed below for reference.   Imaging Studies: CT Shoulder Right Wo Contrast  Result Date: 09/17/2021 CLINICAL DATA:  Fall.  Right scapular fracture EXAM: CT OF THE UPPER RIGHT EXTREMITY WITHOUT CONTRAST TECHNIQUE: Multidetector CT imaging of the upper right extremity was performed according to the standard protocol. RADIATION DOSE REDUCTION: This exam was performed according to the departmental dose-optimization program  which includes automated exposure control, adjustment of the mA and/or kV according to patient size and/or use of iterative reconstruction technique. COMPARISON:  X-ray 09/16/2021.  Thoracic MRI 02/18/2012 FINDINGS: Bones/Joint/Cartilage Acute mildly comminuted fracture of the glenoid and supraspinous aspect of the right scapula (series 11, images 35-51). Fracture involves the glenoid articular surface where there is up to 3 mm of articular-surface depression. Y-shaped intra-articular fracture planes at the glenoid (series 22, image 61). The fracture component of the supraspinous scapula is mildly displaced. No additional fractures. Glenohumeral joint alignment is maintained without dislocation. Small glenohumeral joint effusion/hemarthrosis. AC joint intact with mild arthropathy. Chronic severe compression fracture of the T8 vertebral Owen. Remaining imaged osseous structures appear otherwise within normal limits. Ligaments Suboptimally assessed by CT. Muscles and Tendons Rotator cuff tendons grossly intact by CT. No rotator cuff muscle atrophy. Soft tissues Ill-defined fluid and hemorrhage anterior to the subscapularis muscle. No axillary lymphadenopathy. Dependent atelectasis within the right lung field. IMPRESSION: 1. Acute mildly comminuted fracture of the glenoid and supraspinous aspect of the right scapula, as described. 2. Small glenohumeral joint effusion/hemarthrosis. 3. Ill-defined fluid and hemorrhage anterior to the subscapularis muscle. 4.  Chronic severe compression fracture of the T8 vertebral Owen. Electronically Signed   By: Davina Poke D.O.   On: 09/17/2021 12:40   CT Cervical Spine Wo Contrast  Result Date: 09/16/2021 CLINICAL DATA:  Trauma, fall EXAM: CT CERVICAL SPINE WITHOUT CONTRAST TECHNIQUE: Multidetector CT imaging of the cervical spine was performed without intravenous contrast. Multiplanar CT image reconstructions were also generated. RADIATION DOSE REDUCTION: This exam was  performed according to the departmental dose-optimization program which includes automated exposure control, adjustment of the mA and/or kV according to patient size and/or use of iterative reconstruction technique. COMPARISON:  02/14/2011 FINDINGS: Alignment: Alignment of posterior margins of vertebral bodies is unremarkable. Skull base and vertebrae: No recent fracture is seen. There is mild deformity at the base of the odontoid process without break in the cortical margins suggesting old healed fracture. Degenerative changes are noted with bony spurs and facet hypertrophy at multiple levels. Soft tissues and spinal canal: There is no central spinal stenosis. Disc levels: There is encroachment of neural foramina from C3-C7 levels by bony spurs and facet hypertrophy. Upper chest: Linear density in the posterior right upper lung field may suggest scarring. Other: Extensive arterial calcifications are seen. IMPRESSION: No recent fracture is seen in cervical spine. Cervical spondylosis with encroachment of neural foramina from C3-C7 levels. Electronically Signed   By: Elmer Picker M.D.   On: 09/16/2021 21:59   CT HEAD WO CONTRAST (5MM)  Result Date: 09/16/2021 CLINICAL DATA:  Trauma, fall EXAM: CT HEAD WITHOUT CONTRAST TECHNIQUE: Contiguous axial images were obtained from the base of the skull through the vertex without intravenous contrast. RADIATION DOSE REDUCTION: This exam was performed according to the departmental dose-optimization program which includes automated exposure control, adjustment of the mA and/or kV according to patient size and/or use of iterative reconstruction technique. COMPARISON:  MR brain done on 05/11/2020 FINDINGS: Brain: No acute intracranial findings are seen. There are no signs of bleeding within the cranium. Calcifications are noted in basal ganglia on both sides. Cortical sulci are prominent. There is decreased density in periventricular and subcortical white matter.  Vascular: Scattered arterial calcifications are seen. Skull: Hyperostosis frontalis interna is seen. Sinuses/Orbits: Unremarkable. There is suggestion of previous cataract surgery in the optic globes. Other: None. IMPRESSION: No acute intracranial findings are seen in noncontrast CT brain. Atrophy. Small-vessel disease. Electronically Signed   By: Elmer Picker M.D.   On: 09/16/2021 21:53   DG Chest Portable 1 View  Result Date: 09/16/2021 CLINICAL DATA:  Status post fall. EXAM: PORTABLE CHEST 1 VIEW COMPARISON:  May 11, 2020 FINDINGS: The heart size and mediastinal contours are within normal limits. There is marked severity calcification of the aortic arch. Both lungs are clear. The right shoulder is not included in the field of view and is subsequently limited in evaluation. The visualized skeletal structures are unremarkable. IMPRESSION: No active disease. Electronically Signed   By: Virgina Norfolk M.D.   On: 09/16/2021 21:46   CT PELVIS WO CONTRAST  Result Date: 09/16/2021 CLINICAL DATA:  Right hip pain after fall EXAM: CT PELVIS WITHOUT CONTRAST TECHNIQUE: Multidetector CT imaging of the pelvis was performed following the standard protocol without intravenous contrast. RADIATION DOSE REDUCTION: This exam was performed according to the departmental dose-optimization program which includes automated exposure control, adjustment of the mA and/or kV according to patient size and/or use of iterative reconstruction technique. COMPARISON:  Radiographs earlier today FINDINGS: Urinary Tract:  No abnormality visualized. Bowel:  Unremarkable visualized pelvic bowel loops. Vascular/Lymphatic: No  pathologically enlarged lymph nodes. Aortoiliac atherosclerotic calcification Reproductive:  No mass or other significant abnormality Other:  None. Musculoskeletal: Acute minimally displaced fracture of the anterior wall of the right acetabulum. The fracture line extends posteriorly with mild displacement of the  medial acetabular wall (series 3/image 85). Possible nondisplaced fracture of the inferior pubic ramus (series 3/image 103). Compression fracture of the L4 vertebral Owen has a chronic appearance. IMPRESSION: Acute minimally displaced fracture of the anterior acetabulum near the junction with the superior pubic ramus. Possible additional nondisplaced fracture of the inferior pubic ramus Electronically Signed   By: Placido Sou M.D.   On: 09/16/2021 18:35   DG Shoulder Right  Result Date: 09/16/2021 CLINICAL DATA:  Pain after fall EXAM: RIGHT SHOULDER - 2+ VIEW COMPARISON:  None Available. FINDINGS: There is a mildly displaced fracture extending through the glenoid into the Owen of the scapula. The humerus and clavicle are intact. No other abnormalities. IMPRESSION: There is a scapular fracture with mild displacement extending through the glenoid. Recommend CT imaging for better evaluation. Electronically Signed   By: Dorise Bullion III M.D.   On: 09/16/2021 16:06   DG Hip Unilat  With Pelvis 2-3 Views Right  Result Date: 09/16/2021 CLINICAL DATA:  Pain after fall EXAM: DG HIP (WITH OR WITHOUT PELVIS) 2-3V RIGHT COMPARISON:  None Available. FINDINGS: There is a step-off at the junction of the right superior pubic ramus and the acetabulum. The remainder of the pelvic bones are intact. The proximal femurs are intact. IMPRESSION: There is a step-off at the junction of the superior right pubic ramus and the acetabulum. These findings may represent a subtle fracture. CT imaging could better evaluate. No other abnormalities. Electronically Signed   By: Dorise Bullion III M.D.   On: 09/16/2021 16:04    Microbiology: No results found for this or any previous visit.  Labs: CBC: Recent Labs  Lab 09/16/21 1514 09/17/21 0728  WBC 16.2* 7.9  NEUTROABS 13.8*  --   HGB 12.1 10.8*  HCT 36.3 31.2*  MCV 93.6 91.0  PLT 125* 466*   Basic Metabolic Panel: Recent Labs  Lab 09/16/21 1514 09/17/21 0728   NA 138 138  K 3.2* 3.9  CL 108 110  CO2 21* 21*  GLUCOSE 161* 112*  BUN 30* 29*  CREATININE 1.46* 1.47*  CALCIUM 9.7 8.7*   Liver Function Tests: Recent Labs  Lab 09/16/21 1514  AST 26  ALT 22  ALKPHOS 45  BILITOT 1.1  PROT 7.0  ALBUMIN 4.2   CBG: No results for input(s): "GLUCAP" in the last 168 hours.  Discharge time spent: greater than 30 minutes.  This record has been created using Systems analyst. Errors have been sought and corrected,but may not always be located. Such creation errors do not reflect on the standard of care.   Signed: Lorella Nimrod, MD [facility] 09/19/2021

## 2021-09-21 DIAGNOSIS — I10 Essential (primary) hypertension: Secondary | ICD-10-CM

## 2021-09-21 DIAGNOSIS — S42101S Fracture of unspecified part of scapula, right shoulder, sequela: Secondary | ICD-10-CM

## 2021-09-21 DIAGNOSIS — S32401S Unspecified fracture of right acetabulum, sequela: Secondary | ICD-10-CM

## 2021-09-21 DIAGNOSIS — K219 Gastro-esophageal reflux disease without esophagitis: Secondary | ICD-10-CM

## 2021-09-21 DIAGNOSIS — E039 Hypothyroidism, unspecified: Secondary | ICD-10-CM

## 2021-10-16 ENCOUNTER — Encounter: Payer: Self-pay | Admitting: Student

## 2021-10-16 ENCOUNTER — Non-Acute Institutional Stay (SKILLED_NURSING_FACILITY): Payer: Medicare Other | Admitting: Student

## 2021-10-16 DIAGNOSIS — F4320 Adjustment disorder, unspecified: Secondary | ICD-10-CM

## 2021-10-16 NOTE — Progress Notes (Signed)
Location:  [facility] Room Number: [place] [address] of Service:  SNF [phone]) Provider:  Dr. Amada Kingfisher, MD  Patient Care Team: Dewayne Shorter, MD as PCP - [facility])  Extended Emergency Contact Information Primary Emergency Contact: Virgina Organ Address: [place], [place] [address] Home Phone: [phone] Work Phone: [phone] Relation: Daughter  Code Status:  DNR Goals of care: Advanced Directive information    09/17/2021    6:00 PM  Advanced Directives  Does Patient Have a Medical Advance Directive? Yes  Type of Advance Directive Out of facility DNR (pink MOST or yellow form)  Does patient want to make changes to medical advance directive? No - Patient declined  Would patient like information on creating a medical advance directive? No - Patient declined     Chief Complaint  Patient presents with   Acute Visit    Poor Sleep    HPI:  Pt is a 88 y.o. female seen today for an acute visit for poor sleep. Patient admitted for therapy after a fall. She never has issue with sleep at home. Wakes up multiple times at night. Denies nocturia. Daughters and sons at bedside who corroborate story. Discussed concerns with starting a tylenol PM vs advil PM given presence of diphenhydramine. Patient asked numerous questions regarding side effects. She states she hasn't seen a difference since starting melatonin.    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   bisacodyl (DULCOLAX) 10 MG suppository Place 10 mg rectally. Every 24 hours as needed for constipation.   Calcium Carb-Cholecalciferol 600-200 MG-UNIT TABS Take 1 tablet by mouth 3 (three) times daily.   Glucerna (GLUCERNA) LIQD Take 237 mLs by mouth daily.   hydrochlorothiazide (HYDRODIURIL) 25 MG tablet Take 25 mg by mouth daily.   levothyroxine (SYNTHROID) 50 MCG tablet Take 50 mcg by mouth daily. Sat, Sunday   levothyroxine (SYNTHROID) 75 MCG tablet Take 75 mcg by mouth daily before breakfast. Mon,Tues,Wed,Thru,Friday   lisinopril (ZESTRIL) 5 MG tablet Take 5 mg by mouth daily.   melatonin 5 MG TABS Take 5 mg by mouth at bedtime.   omeprazole (PRILOSEC) 20 MG capsule Take 20 mg by mouth daily.   ondansetron (ZOFRAN) 4 MG tablet Take 4 mg by mouth daily as needed for nausea or vomiting.   polyethylene glycol (MIRALAX / GLYCOLAX) 17 g packet Take 17 g by mouth daily.   senna (SENOKOT) 8.6 MG tablet Take 2 tablets by mouth daily.   [DISCONTINUED] atorvastatin (LIPITOR) 40 MG tablet Take 40 mg by mouth every other day.   [DISCONTINUED] denosumab (PROLIA) 60 MG/ML SOLN injection Inject 60 mg into the skin  every 6 (six) months. Administer in upper arm, thigh, or abdomen once a year.   [DISCONTINUED] meclizine (ANTIVERT) 25 MG tablet Take 25 mg by mouth 3 (three) times daily as needed for dizziness. Reported on 06/22/2015   [DISCONTINUED] oxyCODONE-acetaminophen (PERCOCET/ROXICET) 5-325 MG tablet Take 1 tablet by mouth every 4 (four) hours as needed for moderate pain or severe pain.   No facility-administered encounter medications on file as of 10/16/2021.    Review of Systems  Immunization History  Administered Date(s) Administered   PFIZER(Purple Top)SARS-COV-2  Vaccination 02/10/2019, 03/03/2019   Pertinent  Health Maintenance Due  Topic Date Due   DEXA SCAN  Never done   INFLUENZA VACCINE  09/05/2021      09/17/2021    6:00 PM 09/17/2021    8:15 PM 09/18/2021   10:00 AM 09/18/2021   11:00 PM 09/19/2021    9:00 AM  Fall Risk  Patient Fall Risk Level High fall risk Moderate fall risk High fall risk High fall risk High fall risk   Functional Status Survey:    Vitals:   10/16/21 1104  BP: 111/70  Pulse: 71  Resp: 16  Temp: (!) 97 F (36.1 C)  SpO2: 96%  Weight: 118 lb 12.8 oz (53.9 kg)  Height: '5\' 2"'$  (1.575 m)   Body mass index is 21.73 kg/m. Physical Exam  Labs reviewed: Recent Labs    09/16/21 1514 09/17/21 0728  NA 138 138  K 3.2* 3.9  CL 108 110  CO2 21* 21*  GLUCOSE 161* 112*  BUN 30* 29*  CREATININE 1.46* 1.47*  CALCIUM 9.7 8.7*   Recent Labs    09/16/21 1514  AST 26  ALT 22  ALKPHOS 45  BILITOT 1.1  PROT 7.0  ALBUMIN 4.2   Recent Labs    09/16/21 1514 09/17/21 0728  WBC 16.2* 7.9  NEUTROABS 13.8*  --   HGB 12.1 10.8*  HCT 36.3 31.2*  MCV 93.6 91.0  PLT 125* 110*   Lab Results  Component Value Date   TSH 0.604 05/11/2020   No results found for: "HGBA1C" No results found for: "CHOL", "HDL", "LDLCALC", "LDLDIRECT", "TRIG", "CHOLHDL"  Significant Diagnostic Results in last 30 days:  CT Shoulder Right Wo Contrast  Result Date: 09/17/2021 CLINICAL DATA:  Fall.  Right scapular fracture EXAM: CT OF THE UPPER RIGHT EXTREMITY WITHOUT CONTRAST TECHNIQUE: Multidetector CT imaging of the upper right extremity was performed according to the standard protocol. RADIATION DOSE REDUCTION: This exam was performed according to the departmental dose-optimization program which includes automated exposure control, adjustment of the mA and/or kV according to patient size and/or use of iterative reconstruction technique. COMPARISON:  X-ray 09/16/2021.  Thoracic MRI 02/18/2012 FINDINGS: Bones/Joint/Cartilage Acute mildly  comminuted fracture of the glenoid and supraspinous aspect of the right scapula (series 11, images 35-51). Fracture involves the glenoid articular surface where there is up to 3 mm of articular-surface depression. Y-shaped intra-articular fracture planes at the glenoid (series 22, image 61). The fracture component of the supraspinous scapula is mildly displaced. No additional fractures. Glenohumeral joint alignment is maintained without dislocation. Small glenohumeral joint effusion/hemarthrosis. AC joint intact with mild arthropathy. Chronic severe compression fracture of the T8 vertebral body. Remaining imaged osseous structures appear otherwise within normal limits. Ligaments Suboptimally assessed by CT. Muscles and Tendons Rotator cuff tendons grossly intact by CT. No rotator cuff muscle atrophy. Soft tissues Ill-defined fluid and hemorrhage anterior to the subscapularis muscle. No axillary lymphadenopathy. Dependent atelectasis within the right lung field. IMPRESSION:  1. Acute mildly comminuted fracture of the glenoid and supraspinous aspect of the right scapula, as described. 2. Small glenohumeral joint effusion/hemarthrosis. 3. Ill-defined fluid and hemorrhage anterior to the subscapularis muscle. 4. Chronic severe compression fracture of the T8 vertebral body. Electronically Signed   By: Davina Poke D.O.   On: 09/17/2021 12:40   CT Cervical Spine Wo Contrast  Result Date: 09/16/2021 CLINICAL DATA:  Trauma, fall EXAM: CT CERVICAL SPINE WITHOUT CONTRAST TECHNIQUE: Multidetector CT imaging of the cervical spine was performed without intravenous contrast. Multiplanar CT image reconstructions were also generated. RADIATION DOSE REDUCTION: This exam was performed according to the departmental dose-optimization program which includes automated exposure control, adjustment of the mA and/or kV according to patient size and/or use of iterative reconstruction technique. COMPARISON:  02/14/2011 FINDINGS:  Alignment: Alignment of posterior margins of vertebral bodies is unremarkable. Skull base and vertebrae: No recent fracture is seen. There is mild deformity at the base of the odontoid process without break in the cortical margins suggesting old healed fracture. Degenerative changes are noted with bony spurs and facet hypertrophy at multiple levels. Soft tissues and spinal canal: There is no central spinal stenosis. Disc levels: There is encroachment of neural foramina from C3-C7 levels by bony spurs and facet hypertrophy. Upper chest: Linear density in the posterior right upper lung field may suggest scarring. Other: Extensive arterial calcifications are seen. IMPRESSION: No recent fracture is seen in cervical spine. Cervical spondylosis with encroachment of neural foramina from C3-C7 levels. Electronically Signed   By: Elmer Picker M.D.   On: 09/16/2021 21:59   CT HEAD WO CONTRAST (5MM)  Result Date: 09/16/2021 CLINICAL DATA:  Trauma, fall EXAM: CT HEAD WITHOUT CONTRAST TECHNIQUE: Contiguous axial images were obtained from the base of the skull through the vertex without intravenous contrast. RADIATION DOSE REDUCTION: This exam was performed according to the departmental dose-optimization program which includes automated exposure control, adjustment of the mA and/or kV according to patient size and/or use of iterative reconstruction technique. COMPARISON:  MR brain done on 05/11/2020 FINDINGS: Brain: No acute intracranial findings are seen. There are no signs of bleeding within the cranium. Calcifications are noted in basal ganglia on both sides. Cortical sulci are prominent. There is decreased density in periventricular and subcortical white matter. Vascular: Scattered arterial calcifications are seen. Skull: Hyperostosis frontalis interna is seen. Sinuses/Orbits: Unremarkable. There is suggestion of previous cataract surgery in the optic globes. Other: None. IMPRESSION: No acute intracranial findings  are seen in noncontrast CT brain. Atrophy. Small-vessel disease. Electronically Signed   By: Elmer Picker M.D.   On: 09/16/2021 21:53   DG Chest Portable 1 View  Result Date: 09/16/2021 CLINICAL DATA:  Status post fall. EXAM: PORTABLE CHEST 1 VIEW COMPARISON:  May 11, 2020 FINDINGS: The heart size and mediastinal contours are within normal limits. There is marked severity calcification of the aortic arch. Both lungs are clear. The right shoulder is not included in the field of view and is subsequently limited in evaluation. The visualized skeletal structures are unremarkable. IMPRESSION: No active disease. Electronically Signed   By: Virgina Norfolk M.D.   On: 09/16/2021 21:46   CT PELVIS WO CONTRAST  Result Date: 09/16/2021 CLINICAL DATA:  Right hip pain after fall EXAM: CT PELVIS WITHOUT CONTRAST TECHNIQUE: Multidetector CT imaging of the pelvis was performed following the standard protocol without intravenous contrast. RADIATION DOSE REDUCTION: This exam was performed according to the departmental dose-optimization program which includes automated exposure control, adjustment of the mA  and/or kV according to patient size and/or use of iterative reconstruction technique. COMPARISON:  Radiographs earlier today FINDINGS: Urinary Tract:  No abnormality visualized. Bowel:  Unremarkable visualized pelvic bowel loops. Vascular/Lymphatic: No pathologically enlarged lymph nodes. Aortoiliac atherosclerotic calcification Reproductive:  No mass or other significant abnormality Other:  None. Musculoskeletal: Acute minimally displaced fracture of the anterior wall of the right acetabulum. The fracture line extends posteriorly with mild displacement of the medial acetabular wall (series 3/image 85). Possible nondisplaced fracture of the inferior pubic ramus (series 3/image 103). Compression fracture of the L4 vertebral body has a chronic appearance. IMPRESSION: Acute minimally displaced fracture of the  anterior acetabulum near the junction with the superior pubic ramus. Possible additional nondisplaced fracture of the inferior pubic ramus Electronically Signed   By: Placido Sou M.D.   On: 09/16/2021 18:35   DG Shoulder Right  Result Date: 09/16/2021 CLINICAL DATA:  Pain after fall EXAM: RIGHT SHOULDER - 2+ VIEW COMPARISON:  None Available. FINDINGS: There is a mildly displaced fracture extending through the glenoid into the body of the scapula. The humerus and clavicle are intact. No other abnormalities. IMPRESSION: There is a scapular fracture with mild displacement extending through the glenoid. Recommend CT imaging for better evaluation. Electronically Signed   By: Dorise Bullion III M.D.   On: 09/16/2021 16:06   DG Hip Unilat  With Pelvis 2-3 Views Right  Result Date: 09/16/2021 CLINICAL DATA:  Pain after fall EXAM: DG HIP (WITH OR WITHOUT PELVIS) 2-3V RIGHT COMPARISON:  None Available. FINDINGS: There is a step-off at the junction of the right superior pubic ramus and the acetabulum. The remainder of the pelvic bones are intact. The proximal femurs are intact. IMPRESSION: There is a step-off at the junction of the superior right pubic ramus and the acetabulum. These findings may represent a subtle fracture. CT imaging could better evaluate. No other abnormalities. Electronically Signed   By: Dorise Bullion III M.D.   On: 09/16/2021 16:04    Assessment/Plan 1. Adjustment disorder, unspecified type Patient is having issues with sleep. No issues outside of nursing home setting. Started on melatonin yesterday with minimal improvement. Issue is sleep maintenance not initiation. Nursing states she is up throughout the night as well. Will plan to start with trazodone 50 mg nightly and adjust as need for patient based on level of daytime sedation and ability to work with therapy.    Family/ staff Communication: Daughters, sons, Engineer, civil (consulting).   Labs/tests ordered:  none  Tomasa Rand,  MD, [facility] [phone]

## 2021-11-06 ENCOUNTER — Telehealth: Payer: Self-pay | Admitting: Family Medicine

## 2021-11-06 NOTE — Telephone Encounter (Signed)
Home Health verbal orders [place] Name:[facility] Agency Name: [facility] number: [phone]  [id number] OT/PT/Skilled nursing/Social Work/Speech:  Reason:PT // Skilled nursing   Frequency: PT 2 W 2   1 W 6 Skilled nursing 2 W 4    1 W 4  Please forward to [employer] pool or providers CMA   Orders place in error

## 2021-11-10 ENCOUNTER — Telehealth: Payer: Self-pay | Admitting: Family Medicine

## 2021-11-10 NOTE — Telephone Encounter (Signed)
This is not a [facility] pt or a [facility] pt

## 2021-11-10 NOTE — Telephone Encounter (Signed)
Home Health verbal orders Caller Name: esther  Agency Name: [facility] number: [phone], secured, no ext   Requesting OT/PT/Skilled nursing/Social Work/Speech: OT   Reason:  Frequency: one week one, two week two   Please forward to [facility] pool or providers CMA   *I see pt isn't one of [facility]'s pts or that the pt has been seen by him

## 2021-11-17 ENCOUNTER — Telehealth: Payer: Self-pay | Admitting: Family Medicine

## 2021-11-17 NOTE — Telephone Encounter (Signed)
error 

## 2022-12-30 IMAGING — CR DG CHEST 2V
2 series · 2 of 2 positions shown · non-contrast
Comparison: None.

CLINICAL DATA: Dizziness

EXAM:
CHEST - 2 VIEW

[chest pa]
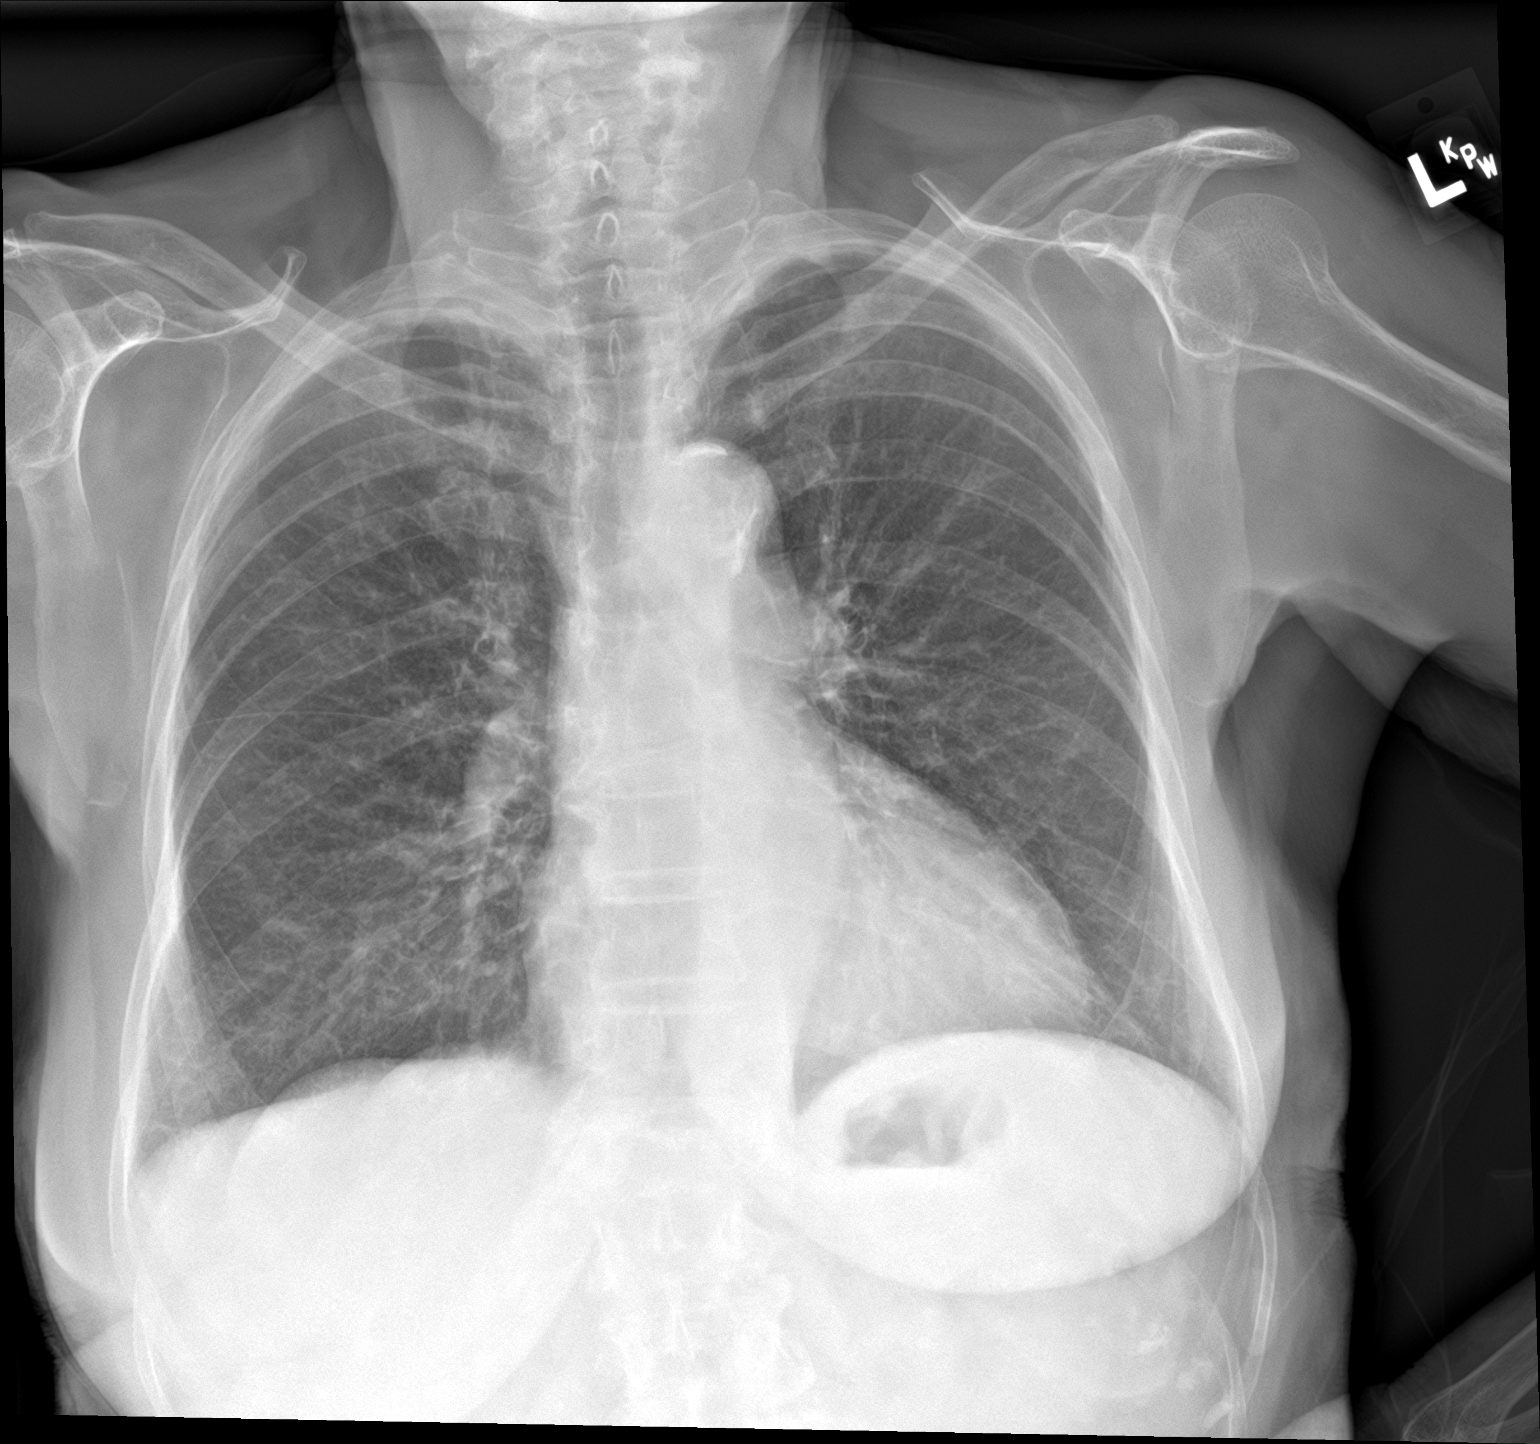

[chest lat]
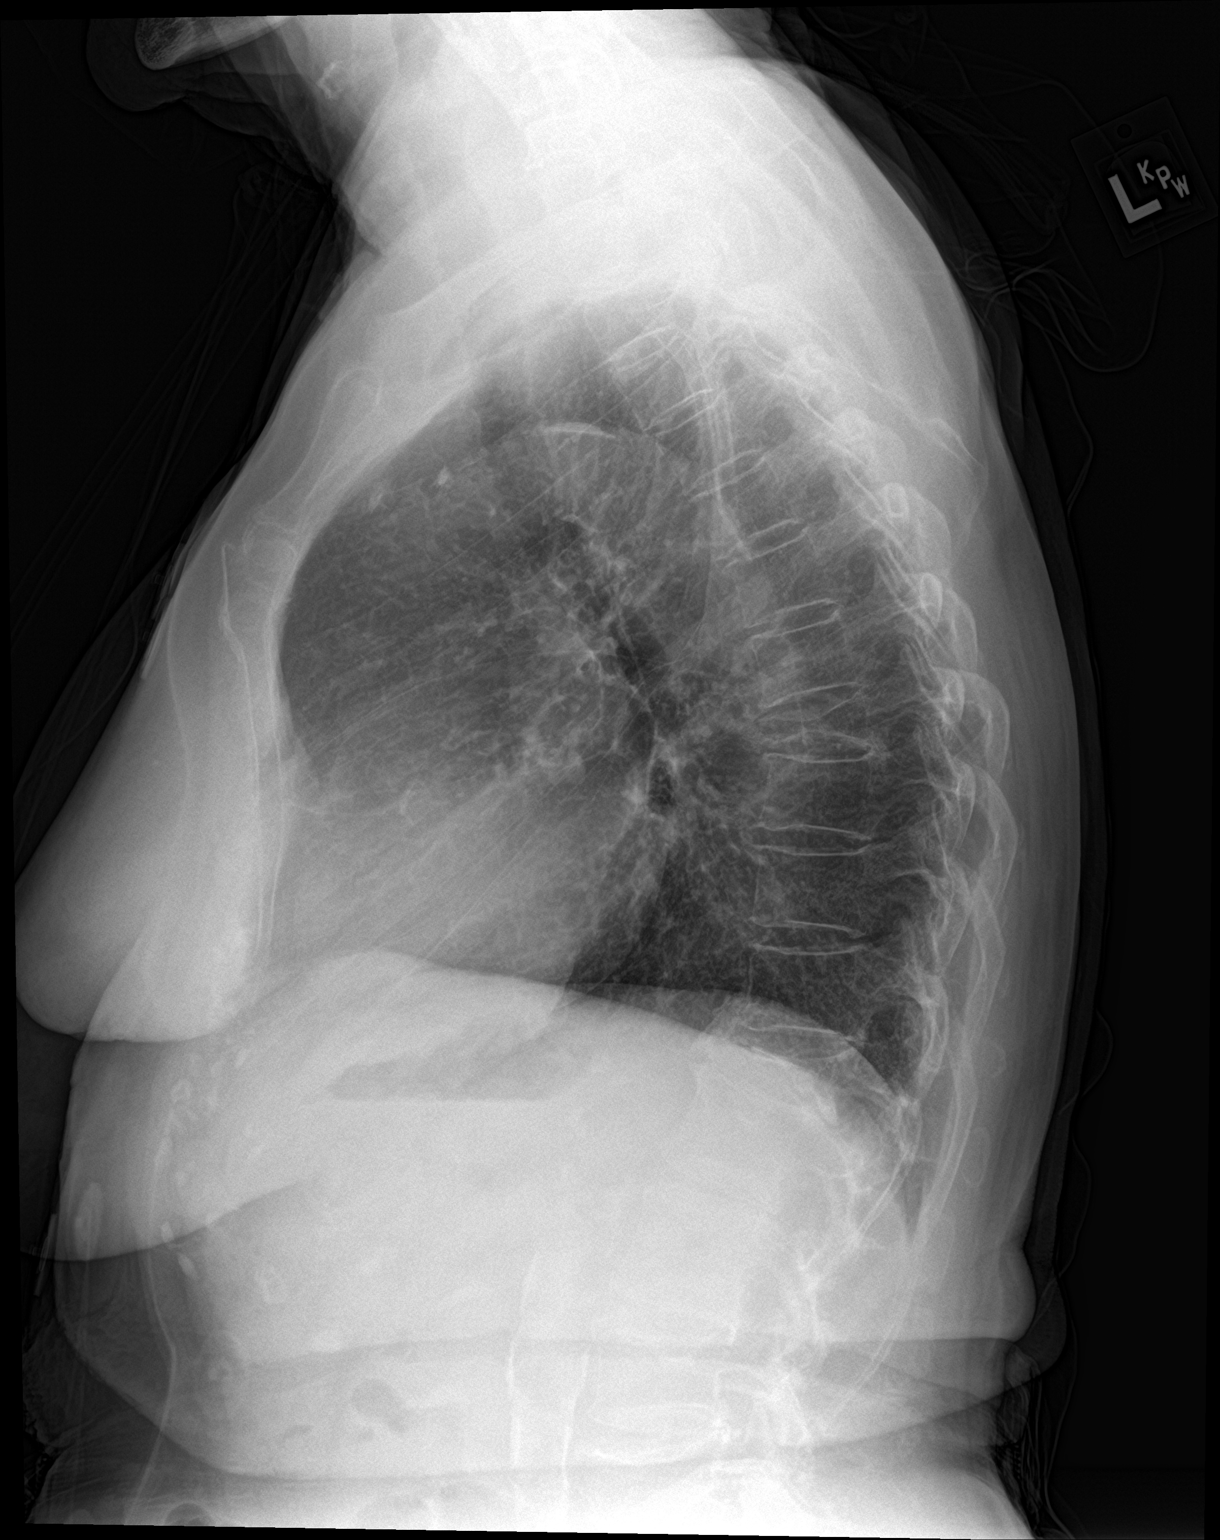

[2 of 2 positions shown; findings below may reference images not displayed]

FINDINGS: The heart size and mediastinal contours are within normal limits.
Aortic knob calcifications are seen. Both lungs are clear. The
visualized skeletal structures are unremarkable.
IMPRESSION: No active cardiopulmonary disease.
